# Patient Record
Sex: Female | Born: 1940 | Race: White | Hispanic: No | Smoking: Former smoker
Health system: Southern US, Community
[De-identification: ages and names within clinical notes are randomized; demographics above are authoritative.]

## PROBLEM LIST (undated history)

## (undated) DIAGNOSIS — I10 Essential (primary) hypertension: Secondary | ICD-10-CM

## (undated) DIAGNOSIS — I509 Heart failure, unspecified: Secondary | ICD-10-CM

## (undated) DIAGNOSIS — I4891 Unspecified atrial fibrillation: Secondary | ICD-10-CM

## (undated) DIAGNOSIS — N183 Chronic kidney disease, stage 3 unspecified: Secondary | ICD-10-CM

## (undated) DIAGNOSIS — E785 Hyperlipidemia, unspecified: Secondary | ICD-10-CM

## (undated) DIAGNOSIS — E669 Obesity, unspecified: Secondary | ICD-10-CM

## (undated) DIAGNOSIS — E079 Disorder of thyroid, unspecified: Secondary | ICD-10-CM

## (undated) HISTORY — DX: Hyperlipidemia, unspecified: E78.5

## (undated) HISTORY — DX: Disorder of thyroid, unspecified: E07.9

## (undated) HISTORY — DX: Essential (primary) hypertension: I10

## (undated) HISTORY — DX: Obesity, unspecified: E66.9

---
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
## 1975-07-06 HISTORY — PX: PARTIAL HYSTERECTOMY: SHX80

## 1998-07-05 HISTORY — PX: CHOLECYSTECTOMY: SHX55

## 2007-12-31 ENCOUNTER — Ambulatory Visit: Payer: Self-pay | Admitting: Cardiology

## 2007-12-31 ENCOUNTER — Inpatient Hospital Stay (HOSPITAL_COMMUNITY): Admission: EM | Admit: 2007-12-31 | Discharge: 2008-01-03 | Payer: Self-pay | Admitting: Cardiology

## 2009-12-30 ENCOUNTER — Ambulatory Visit: Payer: Self-pay | Admitting: Cardiovascular Disease

## 2010-11-17 NOTE — Discharge Summary (Signed)
Connie Cooley, Connie Cooley               ACCOUNT NO.:  [id number]   MEDICAL RECORD NO.:  [id number]          PATIENT TYPE:  INP   LOCATION:  4729                         FACILITY:  [facility]   PHYSICIAN:  Juanda Bond. Burt Knack, MD  DATE OF BIRTH:  1941-02-10   DATE OF ADMISSION:  12/31/2007  DATE OF DISCHARGE:  01/03/2008                               DISCHARGE SUMMARY   PRIMARY CARDIOLOGIST:  Juanda Bond. Burt Knack, MD   PRIMARY CARE PHYSICIAN:  Leavy Cella, MD, [place]   PROCEDURES PERFORMED DURING HOSPITALIZATION:  1. Cardiac catheterization completed by Dr. Sherren Mocha on January 02, 2008.  A:  Mild diffuse luminal irregularities in the right coronary artery and  proximal LAD without significant stenosis, hyperdynamic LV systolic  function with LVEF of 70%-75%, widely patent renal arteries.  Recommend  medical therapy and titration of anti-hypertensive medications   FINAL DISCHARGE DIAGNOSES:  1. Chest pain.  A:  Status post cardiac catheterization revealing minimal nonobstructive  coronary artery disease.  B:  Suspect secondary to hypertensive urgency.  1. Malignant hypertension.  2. Mild thrombocytopenia, negative heparin-induced thrombocytopenia      profile.  3. Hypokalemia.  4. Diabetes.  5. Hyperlipidemia.  6. Hypothyroidism.   HOSPITAL COURSE:  This is a 70 year old Caucasian female with history of  diabetes, hypertension, hyperlipidemia who was seen at [facility] secondary to chest discomfort which awakened her from  sleep.  The patient also had associated palpitations and mild nausea.  The patient was found to be hypertensive with a blood pressure of  222/120 on EMS arrival.  The patient was given nitroglycerin sublingual  in the emergency department and the pain diminished.  The patient states  that she has had increasing frequency over the last several days of  hypertension but prior to this no other chest discomfort associated.  The patient was  evaluated with a nuclear stress test approximately 5  years ago.   The patient was seen and examined by Dr. Karma Lew, cardiology fellow  for Dr. Sherren Mocha.  The patient was admitted to rule out myocardial  infarction and to have that her blood pressure control.  She had mildly  elevated biomarkers but it was felt that it was related to her  hypertensive etiology.  The patient was started on Lovenox and kept  n.p.o. for evaluation with followup cardiac catheterization if  necessary.   The patient was seen and examined over the weekend by Dr. Terald Sleeper and  Rosanne Sack, nurse practitioner.  The patient was found to have some  positive hematuria.  It was also evaluated because of thrombocytopenia  with platelets of 91.  A HIT profile was completed and initially was  negative.  Repeat further labs scanning revealed negative.  The  patient's hemoglobin A1c was found to be mildly elevated at 8.6.  The  patient did not have any recurrence of chest pain or palpitations once  her blood pressure was well controlled.  The patient remained stable and  had subsequent plans for cardiac catheterization which were completed on  January 02, 2008.  Please see Dr. Legrand Como Cooper's thorough cardiac  catheterization note for more details.  The patient had no episodes of  chest pain during the catheterization.  There were no evidence of  bleeding, hematoma, or signs of infection at infection at the cath  sites.  On January 03, 2008, the patient was seen and examined by Dr. Burt Knack  and found to be stable.  She was mildly hypokalemic with potassium of  3.2 which was repleted.  Her platelets had increased to 116.  The  patient had medications adjusted with new medications added to include  Norvasc 10 mg daily, clonidine 0.1 mg b.i.d., and potassium 20 mEq  daily.  She was advised to stop taking diltiazem and continue all other  medications prior to admission.   DISCHARGE LABORATORY DATA:  Sodium 141,  potassium 3.2, chloride 104, CO2  26, BUN 11, creatinine 0.53, glucose 200 (potassium was repleted).  Hemoglobin 13.3, hematocrit 38.6, white blood cells 6.3, platelets 116.  The patient's blood pressure was mildly elevated at 183/82, heart rate  59, respirations 18 with a temperature of 97.4.  Troponins 0.09.  EKG  revealing sinus bradycardia, ventricular rate of 50 beats per minute  with nonspecific T-wave abnormality in leads V5 and V6 dated January 01, 2008.   DISCHARGE MEDICATIONS:  1. Diovan 320 mg daily.  2. Tekturna 150 mg daily.  3. Norvasc 10 mg daily (new prescription provided).  4. Hydrochlorothiazide 12.5 mg daily.  5. Clonidine 0.1 mg twice a day (new prescription provided).  6. K-Dur 20 mEq daily (new prescription provided).  7. Synthroid 125 mcg daily.  8. Detrol LA 4 mg daily.  9. Metformin 1000 mg daily (the patient is to start taking on January 04, 2008).  10.Sertraline 100 mg daily.  11.Insulin 100 units daily in a.m.  12.Aspirin 81 mg daily.   FOLLOWUP APPOINTMENTS:  1. The patient is to follow up with Dr. Leavy Cella in 2 weeks for      post hospitalization appointment.  2. During that appointment, the patient will need to have a BMET drawn      for evaluation of kidney function post catheterization in the      setting of hypertension and diabetes.  3. The patient has been advised on post cardiac catheterization      instructions with particular emphasis on the right groin site for      evidence of bleeding, hematoma, or signs of infection.   Time spent with the patient to include physician time is 45 minutes.     Phill Myron. Purcell Nails, NP      Juanda Bond. Burt Knack, MD  Electronically Signed   KML/MEDQ  D:  01/03/2008  T:  01/04/2008  Job:  [id number]   cc:   Leavy Cella, M.D.

## 2010-11-17 NOTE — Assessment & Plan Note (Signed)
[facility] CARDIOLOGY OFFICE NOTE   [name], [name]            MRN:          [id number]  DATE:12/30/2009                            DOB:          19-Dec-1940    CHIEF COMPLAINT:  Hypertension.   HISTORY OF PRESENT ILLNESS:  [name] is 70 year old female past  medical history significant for malignant hypertension, obesity,  hyperlipidemia, diabetes who is presenting for management of her blood  pressure.  The patient states that over the past year, she has had 8  separate presentations to the emergency room with hypertension.  Usually, she is checking her blood pressure with her cuff at home and  realized that is severely elevated and she reports.  One time in the  past several months, she woke up in the middle of the night with  nosebleeds and was found to have systolic blood pressure over 200s.  She  has had multiple changes to her medications and most recently, she has  had the clonidine patch 0.2 mg added to a regimen which already include  clonidine 0.2 mg t.i.d.  Today, she comes in complaining of dizziness.  In general, the patient denies any chest discomfort, shortness of  breath, lower extremity edema, headaches, or syncopal episodes  associated with high blood pressure or otherwise.  She states that she  is dizzy for the first time this morning.  She states that she is very  compliant with her medical regimen and very rarely if ever misses any  doses.  She does use anti-inflammatory medications on a daily basis and  she does endorse issues with snoring and bad sleep habits.   PAST MEDICAL HISTORY:  As above in HPI.   SOCIAL HISTORY:  No current tobacco or alcohol use.  She lives by  herself.   FAMILY HISTORY:  Negative for premature coronary artery disease.   ALLERGIES:  No known drug allergies.   CURRENT MEDICATIONS:  Clonidine patch 0.2 mg to be changed weekly,  clonidine 0.2 mg  p.o. t.i.d., amlodipine 10 mg daily, Diovan 320 mg  daily, lisinopril 40 mg daily, hydralazine 50 mg every 6 hours,  sertraline, metformin, Lasix 40 mg daily, aspirin 81 mg daily,  oxybutynin, simvastatin 40 mg every evening, Humulin 70/30 insulin.   REVIEW OF SYSTEMS:  As in HPI.  In addition, the patient endorses  anxiety and depression.  Other systems reviewed and are negative.   PHYSICAL EXAMINATION:  VITAL SIGNS:  Today, her blood pressure is 116/56  with a heart rate of 43, this is a large cuff.  Retaken in the right  arm, blood pressure 88/52.  She is satting 98% on room air.  She weighs  203 pounds.  GENERAL:  No acute distress.  HEENT:  Normocephalic, atraumatic.  NECK:  Supple.  No JVD.  No carotid bruits.  HEART:  Regular rate and rhythm with a 2/6 systolic murmur along the  left sternal border.  LUNGS:  Clear bilaterally.  ABDOMEN:  Soft, nontender.  EXTREMITIES:  Without edema.  SKIN:  Warm and dry.  MUSCULOSKELETAL:  Bilateral upper and lower extremity strength 5/5.  NEUROLOGIC:  Generally nonfocal.  EKG taken in today in the office demonstrates sinus bradycardia with a  rate of 44 beats per minute.  There is a mildly delayed R-wave  progression.  No significant ST or T-wave abnormalities.  Review of  echocardiogram report dated February 2011, her ejection fraction was  within normal limits.  There was a relaxation abnormality and there was  mild mitral regurgitation and a sclerotic aortic valve.  Review of the  patient's lab dated September 16, 2009, her aldosterone was 1.2, her renin  activity was less than 0.15.  Cortisol was 11.2.  Review of additional  blood work from March demonstrates sodium of 132, potassium 4.6,  chloride 94, CO2 30, BUN 37, creatinine 0.9, glucose is 105.  Repeat in  June 2011, sodium 141, potassium 3.7, chloride 104, CO2 24, BUN 14,  creatinine 0.6, glucose 183.  LFTs were within normal limits.  Review of  other records indicate that she had  an MRI of the abdomen which have  ruled out renal artery stenosis.   ASSESSMENT AND PLAN:  A 71 year old female with malignant hypertension  on multiple antihypertensives.  It appears the patient is on p.o.  clonidine in addition to the clonidine patch, which is likely the cause  of her hypotension and bradycardia today.  Because she is currently  symptomatic from the hypotension and bradycardia and she lives alone, I  have arranged admission to the [facility] for  observation and medical management of her hypertension.  I have  discontinued the clonidine patch today here in the office which may be  the only current intervention that she needs.  I have advised the  patient to stop all her anti-inflammatory medications as she states she  currently takes Advil on a daily basis.  She will also need to have a  sleep study arranged in the future.  She likely has obstructive sleep  apnea and would benefit from CPAP.  We will arrange to see her after  hospital discharge for further medication management.  At that time, a  repeat renin and aldosterone level may be helpful in guiding further  medical changes.     Arlee Muslim, MD  Electronically Signed    SGA/MedQ  DD: 12/30/2009  DT: 12/31/2009  Job #: [id number]

## 2010-11-17 NOTE — H&P (Signed)
NAMELARELLE, ARBON               ACCOUNT NO.:  [id number]   MEDICAL RECORD NO.:  [id number]          PATIENT TYPE:  INP   LOCATION:  4729                         FACILITY:  [facility]   PHYSICIAN:  Karma Lew, MD          DATE OF BIRTH:  01-09-1941   DATE OF ADMISSION:  12/31/2007  DATE OF DISCHARGE:                              HISTORY & PHYSICAL   CHIEF COMPLAINT:  Chest pain x5 hours.   HISTORY OF PRESENTING IDLENESS:  The patient is a 70 year old white  female with history of diabetes, hypertension, hyperlipidemia, and  complaints of chest discomfort that woke her up from sleep.  At  approximately 5 p.m. today, it was associated with palpitations, some  mild nausea, and she called EMS.  She was found to have blood pressure  of 222/120 on EMS' arrival, and her chest pain resolved after receiving  nitroglycerin in the emergency department.  She reports that she has had  a couple of these episodes that have been increasing in frequency over  the past several days but prior to this has not had any type of chest  discomfort.  She had a negative nuclear stress test 5 years ago and no  prior significant cardiac history.   PAST MEDICAL HISTORY:  1. Diabetes, on insulin.  2. Hypertension.  3. Hyperlipidemia.  4. Hypothyroidism.   MEDICATIONS:  1. Lantus 100 units a day.  2. Synthroid 125 mcg a day.  3. Metformin 1 g b.i.d.  4. Aspirin 81 mg a day.  5. Diovan, she does not recall the dose, and she does not recall any      other medication that she is on.   SOCIAL HISTORY:  She lives in [place].  Former smoker, quit 30 years  ago.  No alcohol, no drug use.   FAMILY HISTORY:  Noncontributory to the patient's current medical  condition.   ALLERGIES:  No known drug allergies.   REVIEW OF SYSTEMS:  Negative 11-point review of systems except for those  as dictated in the above HPI.   PHYSICAL EXAMINATION:  VITAL SIGNS:  Blood pressure is 174/90; heart  rate of 78; and T-max, she is  afebrile.  GENERAL:  Well-developed, well-nourished white female in no acute  distress.  HEENT:  Moist mucous membranes.  Sclerae anicteric.  No conjunctival  pallor.  NECK:  Supple.  Full range of motion.  No jugular venous distention.  CARDIOVASCULAR:  Regular rate and rhythm.  No rubs, murmurs, or gallops.  CHEST:  Clear to auscultation bilaterally.  No wheezes, rales, or  rhonchi.  ABDOMEN:  Soft, nontender, and nondistended.  Normoactive bowel sounds.  EXTREMITIES:  No peripheral edema, pulses 2+ bilaterally.  NEUROLOGIC:  Alert and oriented x3.  Cranial nerves II through XII  grossly intact and nonfocal exam.   Chest x-ray demonstrates no acute infiltrative process.  EKG normal  sinus rhythm, nonspecific ST and T-wave abnormality.   LABORATORY DATA:  Significant for sodium 138, potassium 3.9, BUN and  creatinine 19 and 0.57, and glucose 165.  Her myoglobin is within normal  limits.  Her troponin high as mildly elevated at 0.59.  Her liver  function tests are within normal limits.  White count of 11.2,  hemoglobin is 16.2.   IMPRESSION:  1. Acute coronary syndrome, unstable angina versus non-ST segment      elevation myocardial infarction.  2. Diabetes.  3. Hypothyroidism.  4. Uncontrolled hypertension.  5. Possible hypertensive urgency.   PLAN:  I think that the patient's mildly elevated biomarkers may be  related to her hypertensive etiology.  We will have to follow serial  biomarkers to see if she truly has any acute coronary syndrome.  At  least for now, medically manage this with heparin protocol and restart  it in the morning as she received Lovenox at 8 o'clock tonight.  We will  also start her on statin, beta-blocker, ACE inhibitor and aspirin.  Diabetes, controlled with sliding-scale insulin, her home dose of Lantus  will be cut in half due to her n.p.o. status.  Hypothyroidism, we will  check TSH, otherwise continue her home dose.      Karma Lew, MD   Electronically Signed     JT/MEDQ  D:  12/31/2007  T:  12/31/2007  Job:  [id number]

## 2010-11-17 NOTE — Letter (Signed)
December 30, 2009    Dr. Reinaldo Meeker  [address], [place]  [address]   RE:  Connie Cooley, Connie Cooley  MRN:  [id number]  /  DOB:  [birth year]   Dear Dr. Henrene Pastor:   I had the pleasure of seeing [name] in clinic today.  As you know  she is a 70 year old female with malignant hypertension.  Today in  clinic she presented with some dizziness, and was found to have a blood  pressure of 88/52, and her EKG revealed sinus bradycardia with a rate of  44 beats per minute.  Apparently the patient is on a clonidine patch 0.2  mg as well as taking clonidine 0.2 mg p.o. t.i.d.  This is likely the  cause of the bradycardia and hypotension.  I have [place] the clonidine  patch today, and have arranged admission to the [facility] for observation.  I have also asked the patient to  stop the anti-inflammatory medications which she takes on a daily basis.  She will also need a sleep study in the future as she likely has  obstructive sleep apnea, and would add benefit from CPAP.   Thank you for the referral of this patient, and please contact the  office with any questions or concerns.    Sincerely,      Arlee Muslim, MD  Electronically Signed    SGA/MedQ  DD: 12/30/2009  DT: 12/30/2009  Job #: [id number]

## 2011-03-25 ENCOUNTER — Encounter: Payer: Self-pay | Admitting: Cardiovascular Disease

## 2011-04-01 LAB — BASIC METABOLIC PANEL
BUN: 14
CO2: 24
CO2: 25
CO2: 26
Calcium: 9.2
Calcium: 9.3
Chloride: 104
Creatinine, Ser: 0.5
Creatinine, Ser: 0.53
GFR calc Af Amer: >60
GFR calc Af Amer: >60
GFR calc non Af Amer: >60
GFR calc non Af Amer: >60
Glucose, Bld: 132 — ABNORMAL HIGH
Glucose, Bld: 172 — ABNORMAL HIGH
Potassium: 3.7
Sodium: 136
Sodium: 136
Sodium: 141

## 2011-04-01 LAB — CBC
HCT: 39.9
Hemoglobin: 13.6
Hemoglobin: 14.1
Hemoglobin: 14.3
Hemoglobin: 14.5
Hemoglobin: 13.3
MCHC: 34.4
MCHC: 34.5
MCHC: 35
MCHC: 35.2
MCV: 83.9
MCV: 85.3
MCV: 85.3
Platelets: 127 — ABNORMAL LOW
Platelets: 131 — ABNORMAL LOW
Platelets: 134 — ABNORMAL LOW
Platelets: 164
RBC: 4.66
RBC: 4.68
RBC: 4.85
RBC: 4.86
RBC: 4.52
RDW: 13.1
RDW: 13.2
RDW: 13.3
RDW: 13.6
WBC: 10.4
WBC: 7.8
WBC: 6.3

## 2011-04-01 LAB — DIFFERENTIAL
Basophils Relative: 0
Basophils Relative: 0
Eosinophils Absolute: 0.1
Eosinophils Relative: 1
Lymphs Abs: 1.6
Monocytes Absolute: 0.6
Monocytes Relative: 6
Monocytes Relative: 7
Neutro Abs: 8 — ABNORMAL HIGH
Neutrophils Relative %: 77

## 2011-04-01 LAB — HEPARIN INDUCED THROMBOCYTOPENIA PNL: Patient O.D.: 0.227

## 2011-04-01 LAB — HEPARIN ANTIBODY SCREEN
Heparin Antibody Screen: NEGATIVE
Heparin Antibody Screen: POSITIVE

## 2011-04-01 LAB — CARDIAC PANEL(CRET KIN+CKTOT+MB+TROPI)
CK, MB: 3.5
Relative Index: INVALID
Relative Index: INVALID
Total CK: 73
Total CK: 77
Troponin I: 0.06
Troponin I: 0.09 — ABNORMAL HIGH

## 2011-04-01 LAB — LIPID PANEL
Cholesterol: 201 — ABNORMAL HIGH
HDL: 29 — ABNORMAL LOW

## 2011-04-01 LAB — FIBRINOGEN: Fibrinogen: 236

## 2011-04-01 LAB — PROTIME-INR
INR: 1
Prothrombin Time: 13.8

## 2011-04-01 LAB — APTT: aPTT: 30

## 2011-04-01 LAB — D-DIMER, QUANTITATIVE: D-Dimer, Quant: 0.22

## 2014-10-24 ENCOUNTER — Other Ambulatory Visit: Payer: Self-pay

## 2014-10-24 NOTE — Patient Outreach (Signed)
[facility]) Care Management  10/24/2014  Connie Cooley [birth year] [id number]  Telephone call to patient regarding primary MD referral.  Unable to reach patient. HIPAA compliant voice message left with call back phone number. Called patients primary MD office and confirmed patients contact phone number with Amy.  Number confirmed [phone]  PLAN:  RNCM will attempt 2nd telephone call to patient within 1 week.   Quinn Plowman RN,BSN,CCM [place] Coordinator [phone]

## 2014-10-28 ENCOUNTER — Ambulatory Visit: Payer: Self-pay

## 2014-10-28 ENCOUNTER — Other Ambulatory Visit: Payer: Self-pay

## 2014-10-28 NOTE — Patient Outreach (Signed)
[place]) Care Management  10/28/2014  SRITHA SNYDER [birth year] [id number]   Second telephone call to patient regarding primary MD  Referral.  Unable to reach patient or leave voice message.  Phone only rang.  PLAN:   RNCM will attempt 3rd telephone outreach to patient within 1 week.  Quinn Plowman RN,BSN,CCM [place] Coordinator [phone]

## 2014-10-30 ENCOUNTER — Ambulatory Visit: Payer: Self-pay

## 2014-11-01 ENCOUNTER — Other Ambulatory Visit: Payer: Self-pay

## 2014-11-01 NOTE — Patient Outreach (Signed)
[facility]) Care Management  11/01/2014  NAMRATA SEANEZ 25-May-1941 [id number]  Third telephone call to patient regarding doctor referral.  No answer.     Plan: RNCM will send outreach letter to attempt contact.    Jone Baseman, RN, MSN [place] Telephonic [facility] Care Management [phone]

## 2014-11-18 NOTE — Patient Outreach (Signed)
[facility] [facility]) Care Management  11/18/2014  CYNDEL URNESS 1941-05-17 [id number]    No response from patient after 3 outreach calls and letter.  Plan: RNCM will forward patient information to Lurline Del for case closure.   RNCM will send letter to primary care physician notifying of case closure.   Jone Baseman, RN, MSN [place] [phone]

## 2014-11-25 NOTE — Patient Outreach (Signed)
[facility]) Care Management  11/25/2014  IKHLAS VAGNONI Jun 28, 1941 [id number]   Received notification from Jon Billings, RN to close case due to unable to contact.  Ronnell Freshwater. [place], [place] Management [place] Assistant Phone: [phone] Fax: [phone]

## 2015-07-27 DIAGNOSIS — R112 Nausea with vomiting, unspecified: Secondary | ICD-10-CM | POA: Diagnosis not present

## 2015-07-27 DIAGNOSIS — N3001 Acute cystitis with hematuria: Secondary | ICD-10-CM | POA: Diagnosis not present

## 2015-08-05 DIAGNOSIS — Z7901 Long term (current) use of anticoagulants: Secondary | ICD-10-CM | POA: Diagnosis not present

## 2015-08-05 DIAGNOSIS — E1142 Type 2 diabetes mellitus with diabetic polyneuropathy: Secondary | ICD-10-CM | POA: Diagnosis not present

## 2015-08-05 DIAGNOSIS — Z6838 Body mass index (BMI) 38.0-38.9, adult: Secondary | ICD-10-CM | POA: Diagnosis not present

## 2015-08-05 DIAGNOSIS — I4891 Unspecified atrial fibrillation: Secondary | ICD-10-CM | POA: Diagnosis not present

## 2015-08-05 DIAGNOSIS — I10 Essential (primary) hypertension: Secondary | ICD-10-CM | POA: Diagnosis not present

## 2015-08-05 DIAGNOSIS — E785 Hyperlipidemia, unspecified: Secondary | ICD-10-CM | POA: Diagnosis not present

## 2015-08-05 DIAGNOSIS — N183 Chronic kidney disease, stage 3 (moderate): Secondary | ICD-10-CM | POA: Diagnosis not present

## 2015-08-05 DIAGNOSIS — E1140 Type 2 diabetes mellitus with diabetic neuropathy, unspecified: Secondary | ICD-10-CM | POA: Diagnosis not present

## 2015-08-05 DIAGNOSIS — E1129 Type 2 diabetes mellitus with other diabetic kidney complication: Secondary | ICD-10-CM | POA: Diagnosis not present

## 2015-08-13 DIAGNOSIS — E11319 Type 2 diabetes mellitus with unspecified diabetic retinopathy without macular edema: Secondary | ICD-10-CM | POA: Diagnosis not present

## 2015-08-13 DIAGNOSIS — H2513 Age-related nuclear cataract, bilateral: Secondary | ICD-10-CM | POA: Diagnosis not present

## 2015-08-19 DIAGNOSIS — I10 Essential (primary) hypertension: Secondary | ICD-10-CM | POA: Diagnosis not present

## 2015-08-19 DIAGNOSIS — N390 Urinary tract infection, site not specified: Secondary | ICD-10-CM | POA: Diagnosis not present

## 2015-08-19 DIAGNOSIS — N183 Chronic kidney disease, stage 3 (moderate): Secondary | ICD-10-CM | POA: Diagnosis not present

## 2015-08-19 DIAGNOSIS — E1129 Type 2 diabetes mellitus with other diabetic kidney complication: Secondary | ICD-10-CM | POA: Diagnosis not present

## 2015-08-19 DIAGNOSIS — I4891 Unspecified atrial fibrillation: Secondary | ICD-10-CM | POA: Diagnosis not present

## 2015-08-19 DIAGNOSIS — Z6838 Body mass index (BMI) 38.0-38.9, adult: Secondary | ICD-10-CM | POA: Diagnosis not present

## 2015-08-19 DIAGNOSIS — E785 Hyperlipidemia, unspecified: Secondary | ICD-10-CM | POA: Diagnosis not present

## 2015-08-19 DIAGNOSIS — Z7901 Long term (current) use of anticoagulants: Secondary | ICD-10-CM | POA: Diagnosis not present

## 2015-08-26 DIAGNOSIS — N390 Urinary tract infection, site not specified: Secondary | ICD-10-CM | POA: Diagnosis not present

## 2015-09-09 DIAGNOSIS — E113293 Type 2 diabetes mellitus with mild nonproliferative diabetic retinopathy without macular edema, bilateral: Secondary | ICD-10-CM | POA: Diagnosis not present

## 2015-09-09 DIAGNOSIS — H2513 Age-related nuclear cataract, bilateral: Secondary | ICD-10-CM | POA: Diagnosis not present

## 2015-09-11 DIAGNOSIS — E875 Hyperkalemia: Secondary | ICD-10-CM | POA: Diagnosis not present

## 2015-09-11 DIAGNOSIS — Z7901 Long term (current) use of anticoagulants: Secondary | ICD-10-CM | POA: Diagnosis not present

## 2015-09-18 DIAGNOSIS — E113312 Type 2 diabetes mellitus with moderate nonproliferative diabetic retinopathy with macular edema, left eye: Secondary | ICD-10-CM | POA: Diagnosis not present

## 2015-10-02 DIAGNOSIS — Z7901 Long term (current) use of anticoagulants: Secondary | ICD-10-CM | POA: Diagnosis not present

## 2015-10-02 DIAGNOSIS — E113312 Type 2 diabetes mellitus with moderate nonproliferative diabetic retinopathy with macular edema, left eye: Secondary | ICD-10-CM | POA: Diagnosis not present

## 2015-11-13 DIAGNOSIS — E113312 Type 2 diabetes mellitus with moderate nonproliferative diabetic retinopathy with macular edema, left eye: Secondary | ICD-10-CM | POA: Diagnosis not present

## 2015-12-05 DIAGNOSIS — E1140 Type 2 diabetes mellitus with diabetic neuropathy, unspecified: Secondary | ICD-10-CM | POA: Diagnosis not present

## 2015-12-05 DIAGNOSIS — E785 Hyperlipidemia, unspecified: Secondary | ICD-10-CM | POA: Diagnosis not present

## 2015-12-05 DIAGNOSIS — E1129 Type 2 diabetes mellitus with other diabetic kidney complication: Secondary | ICD-10-CM | POA: Diagnosis not present

## 2015-12-05 DIAGNOSIS — N183 Chronic kidney disease, stage 3 (moderate): Secondary | ICD-10-CM | POA: Diagnosis not present

## 2015-12-05 DIAGNOSIS — N390 Urinary tract infection, site not specified: Secondary | ICD-10-CM | POA: Diagnosis not present

## 2015-12-05 DIAGNOSIS — I10 Essential (primary) hypertension: Secondary | ICD-10-CM | POA: Diagnosis not present

## 2015-12-05 DIAGNOSIS — Z6838 Body mass index (BMI) 38.0-38.9, adult: Secondary | ICD-10-CM | POA: Diagnosis not present

## 2015-12-05 DIAGNOSIS — I4891 Unspecified atrial fibrillation: Secondary | ICD-10-CM | POA: Diagnosis not present

## 2015-12-05 DIAGNOSIS — E1142 Type 2 diabetes mellitus with diabetic polyneuropathy: Secondary | ICD-10-CM | POA: Diagnosis not present

## 2016-01-01 DIAGNOSIS — E113312 Type 2 diabetes mellitus with moderate nonproliferative diabetic retinopathy with macular edema, left eye: Secondary | ICD-10-CM | POA: Diagnosis not present

## 2016-02-05 DIAGNOSIS — E113312 Type 2 diabetes mellitus with moderate nonproliferative diabetic retinopathy with macular edema, left eye: Secondary | ICD-10-CM | POA: Diagnosis not present

## 2016-02-09 DIAGNOSIS — N390 Urinary tract infection, site not specified: Secondary | ICD-10-CM | POA: Diagnosis not present

## 2016-02-09 DIAGNOSIS — Z6838 Body mass index (BMI) 38.0-38.9, adult: Secondary | ICD-10-CM | POA: Diagnosis not present

## 2016-02-09 DIAGNOSIS — Z7901 Long term (current) use of anticoagulants: Secondary | ICD-10-CM | POA: Diagnosis not present

## 2016-02-16 DIAGNOSIS — K5901 Slow transit constipation: Secondary | ICD-10-CM | POA: Diagnosis not present

## 2016-02-16 DIAGNOSIS — R109 Unspecified abdominal pain: Secondary | ICD-10-CM | POA: Diagnosis not present

## 2016-02-16 DIAGNOSIS — N3000 Acute cystitis without hematuria: Secondary | ICD-10-CM | POA: Diagnosis not present

## 2016-02-16 DIAGNOSIS — R1032 Left lower quadrant pain: Secondary | ICD-10-CM | POA: Diagnosis not present

## 2016-03-18 DIAGNOSIS — E113312 Type 2 diabetes mellitus with moderate nonproliferative diabetic retinopathy with macular edema, left eye: Secondary | ICD-10-CM | POA: Diagnosis not present

## 2016-03-19 DIAGNOSIS — E785 Hyperlipidemia, unspecified: Secondary | ICD-10-CM | POA: Diagnosis not present

## 2016-03-19 DIAGNOSIS — I10 Essential (primary) hypertension: Secondary | ICD-10-CM | POA: Diagnosis not present

## 2016-03-19 DIAGNOSIS — E119 Type 2 diabetes mellitus without complications: Secondary | ICD-10-CM | POA: Diagnosis not present

## 2016-03-22 DIAGNOSIS — Z23 Encounter for immunization: Secondary | ICD-10-CM | POA: Diagnosis not present

## 2016-03-22 DIAGNOSIS — E1140 Type 2 diabetes mellitus with diabetic neuropathy, unspecified: Secondary | ICD-10-CM | POA: Diagnosis not present

## 2016-03-22 DIAGNOSIS — I10 Essential (primary) hypertension: Secondary | ICD-10-CM | POA: Diagnosis not present

## 2016-03-22 DIAGNOSIS — Z7901 Long term (current) use of anticoagulants: Secondary | ICD-10-CM | POA: Diagnosis not present

## 2016-03-22 DIAGNOSIS — E785 Hyperlipidemia, unspecified: Secondary | ICD-10-CM | POA: Diagnosis not present

## 2016-04-20 DIAGNOSIS — N390 Urinary tract infection, site not specified: Secondary | ICD-10-CM | POA: Diagnosis not present

## 2016-04-22 DIAGNOSIS — Z7901 Long term (current) use of anticoagulants: Secondary | ICD-10-CM | POA: Diagnosis not present

## 2016-04-29 DIAGNOSIS — E113312 Type 2 diabetes mellitus with moderate nonproliferative diabetic retinopathy with macular edema, left eye: Secondary | ICD-10-CM | POA: Diagnosis not present

## 2016-05-20 DIAGNOSIS — Z7901 Long term (current) use of anticoagulants: Secondary | ICD-10-CM | POA: Diagnosis not present

## 2016-06-11 DIAGNOSIS — E1149 Type 2 diabetes mellitus with other diabetic neurological complication: Secondary | ICD-10-CM | POA: Diagnosis not present

## 2016-06-11 DIAGNOSIS — E1142 Type 2 diabetes mellitus with diabetic polyneuropathy: Secondary | ICD-10-CM | POA: Diagnosis not present

## 2016-06-11 DIAGNOSIS — E782 Mixed hyperlipidemia: Secondary | ICD-10-CM | POA: Diagnosis not present

## 2016-06-11 DIAGNOSIS — I10 Essential (primary) hypertension: Secondary | ICD-10-CM | POA: Diagnosis not present

## 2016-06-11 DIAGNOSIS — N183 Chronic kidney disease, stage 3 (moderate): Secondary | ICD-10-CM | POA: Diagnosis not present

## 2016-06-11 DIAGNOSIS — E1129 Type 2 diabetes mellitus with other diabetic kidney complication: Secondary | ICD-10-CM | POA: Diagnosis not present

## 2016-06-11 DIAGNOSIS — I6300 Cerebral infarction due to thrombosis of unspecified precerebral artery: Secondary | ICD-10-CM | POA: Diagnosis not present

## 2016-06-11 DIAGNOSIS — I481 Persistent atrial fibrillation: Secondary | ICD-10-CM | POA: Diagnosis not present

## 2016-06-11 DIAGNOSIS — G4733 Obstructive sleep apnea (adult) (pediatric): Secondary | ICD-10-CM | POA: Diagnosis not present

## 2016-06-11 DIAGNOSIS — G608 Other hereditary and idiopathic neuropathies: Secondary | ICD-10-CM | POA: Diagnosis not present

## 2016-07-26 DIAGNOSIS — E1149 Type 2 diabetes mellitus with other diabetic neurological complication: Secondary | ICD-10-CM | POA: Diagnosis not present

## 2016-07-26 DIAGNOSIS — I10 Essential (primary) hypertension: Secondary | ICD-10-CM | POA: Diagnosis not present

## 2016-07-26 DIAGNOSIS — I6350 Cerebral infarction due to unspecified occlusion or stenosis of unspecified cerebral artery: Secondary | ICD-10-CM | POA: Diagnosis not present

## 2016-07-26 DIAGNOSIS — R413 Other amnesia: Secondary | ICD-10-CM | POA: Diagnosis not present

## 2016-07-26 DIAGNOSIS — N183 Chronic kidney disease, stage 3 (moderate): Secondary | ICD-10-CM | POA: Diagnosis not present

## 2016-07-26 DIAGNOSIS — E782 Mixed hyperlipidemia: Secondary | ICD-10-CM | POA: Diagnosis not present

## 2016-07-26 DIAGNOSIS — G4733 Obstructive sleep apnea (adult) (pediatric): Secondary | ICD-10-CM | POA: Diagnosis not present

## 2016-07-26 DIAGNOSIS — I481 Persistent atrial fibrillation: Secondary | ICD-10-CM | POA: Diagnosis not present

## 2016-07-26 DIAGNOSIS — E1142 Type 2 diabetes mellitus with diabetic polyneuropathy: Secondary | ICD-10-CM | POA: Diagnosis not present

## 2016-07-26 DIAGNOSIS — R412 Retrograde amnesia: Secondary | ICD-10-CM | POA: Diagnosis not present

## 2016-07-26 DIAGNOSIS — E1129 Type 2 diabetes mellitus with other diabetic kidney complication: Secondary | ICD-10-CM | POA: Diagnosis not present

## 2016-07-26 DIAGNOSIS — I4892 Unspecified atrial flutter: Secondary | ICD-10-CM | POA: Diagnosis not present

## 2016-09-12 DIAGNOSIS — N3000 Acute cystitis without hematuria: Secondary | ICD-10-CM | POA: Diagnosis not present

## 2016-09-12 DIAGNOSIS — N3090 Cystitis, unspecified without hematuria: Secondary | ICD-10-CM | POA: Diagnosis not present

## 2016-09-16 DIAGNOSIS — E113312 Type 2 diabetes mellitus with moderate nonproliferative diabetic retinopathy with macular edema, left eye: Secondary | ICD-10-CM | POA: Diagnosis not present

## 2016-09-24 DIAGNOSIS — N760 Acute vaginitis: Secondary | ICD-10-CM | POA: Diagnosis not present

## 2016-09-24 DIAGNOSIS — N3001 Acute cystitis with hematuria: Secondary | ICD-10-CM | POA: Diagnosis not present

## 2016-09-28 DIAGNOSIS — K635 Polyp of colon: Secondary | ICD-10-CM | POA: Diagnosis not present

## 2016-09-28 DIAGNOSIS — J9601 Acute respiratory failure with hypoxia: Secondary | ICD-10-CM | POA: Diagnosis not present

## 2016-09-28 DIAGNOSIS — E1142 Type 2 diabetes mellitus with diabetic polyneuropathy: Secondary | ICD-10-CM | POA: Diagnosis not present

## 2016-09-28 DIAGNOSIS — N183 Chronic kidney disease, stage 3 (moderate): Secondary | ICD-10-CM | POA: Diagnosis not present

## 2016-09-28 DIAGNOSIS — R918 Other nonspecific abnormal finding of lung field: Secondary | ICD-10-CM | POA: Diagnosis not present

## 2016-09-28 DIAGNOSIS — K921 Melena: Secondary | ICD-10-CM | POA: Diagnosis not present

## 2016-09-28 DIAGNOSIS — F0390 Unspecified dementia without behavioral disturbance: Secondary | ICD-10-CM | POA: Diagnosis not present

## 2016-09-28 DIAGNOSIS — J159 Unspecified bacterial pneumonia: Secondary | ICD-10-CM | POA: Diagnosis not present

## 2016-09-28 DIAGNOSIS — R52 Pain, unspecified: Secondary | ICD-10-CM | POA: Diagnosis not present

## 2016-09-28 DIAGNOSIS — K922 Gastrointestinal hemorrhage, unspecified: Secondary | ICD-10-CM | POA: Diagnosis not present

## 2016-09-28 DIAGNOSIS — E039 Hypothyroidism, unspecified: Secondary | ICD-10-CM | POA: Diagnosis not present

## 2016-09-28 DIAGNOSIS — I480 Paroxysmal atrial fibrillation: Secondary | ICD-10-CM | POA: Diagnosis not present

## 2016-09-28 DIAGNOSIS — R935 Abnormal findings on diagnostic imaging of other abdominal regions, including retroperitoneum: Secondary | ICD-10-CM | POA: Diagnosis not present

## 2016-09-28 DIAGNOSIS — E119 Type 2 diabetes mellitus without complications: Secondary | ICD-10-CM | POA: Diagnosis not present

## 2016-09-28 DIAGNOSIS — I10 Essential (primary) hypertension: Secondary | ICD-10-CM | POA: Diagnosis not present

## 2016-09-28 DIAGNOSIS — N179 Acute kidney failure, unspecified: Secondary | ICD-10-CM | POA: Diagnosis not present

## 2016-09-28 DIAGNOSIS — Z8673 Personal history of transient ischemic attack (TIA), and cerebral infarction without residual deficits: Secondary | ICD-10-CM | POA: Diagnosis not present

## 2016-09-28 DIAGNOSIS — M1A9XX0 Chronic gout, unspecified, without tophus (tophi): Secondary | ICD-10-CM | POA: Diagnosis not present

## 2016-09-28 DIAGNOSIS — M79604 Pain in right leg: Secondary | ICD-10-CM | POA: Diagnosis not present

## 2016-09-28 DIAGNOSIS — Z7982 Long term (current) use of aspirin: Secondary | ICD-10-CM | POA: Diagnosis not present

## 2016-09-28 DIAGNOSIS — J441 Chronic obstructive pulmonary disease with (acute) exacerbation: Secondary | ICD-10-CM | POA: Diagnosis not present

## 2016-09-28 DIAGNOSIS — Z66 Do not resuscitate: Secondary | ICD-10-CM | POA: Diagnosis not present

## 2016-09-28 DIAGNOSIS — T45511A Poisoning by anticoagulants, accidental (unintentional), initial encounter: Secondary | ICD-10-CM | POA: Diagnosis not present

## 2016-09-28 DIAGNOSIS — G4733 Obstructive sleep apnea (adult) (pediatric): Secondary | ICD-10-CM | POA: Diagnosis not present

## 2016-09-28 DIAGNOSIS — I5032 Chronic diastolic (congestive) heart failure: Secondary | ICD-10-CM | POA: Diagnosis not present

## 2016-09-28 DIAGNOSIS — Z9989 Dependence on other enabling machines and devices: Secondary | ICD-10-CM | POA: Diagnosis not present

## 2016-09-28 DIAGNOSIS — Z7901 Long term (current) use of anticoagulants: Secondary | ICD-10-CM | POA: Diagnosis not present

## 2016-09-28 DIAGNOSIS — I482 Chronic atrial fibrillation: Secondary | ICD-10-CM | POA: Diagnosis not present

## 2016-09-28 DIAGNOSIS — D62 Acute posthemorrhagic anemia: Secondary | ICD-10-CM | POA: Diagnosis not present

## 2016-09-28 DIAGNOSIS — N390 Urinary tract infection, site not specified: Secondary | ICD-10-CM | POA: Diagnosis not present

## 2016-09-28 DIAGNOSIS — R5383 Other fatigue: Secondary | ICD-10-CM | POA: Diagnosis not present

## 2016-09-28 DIAGNOSIS — Z9119 Patient's noncompliance with other medical treatment and regimen: Secondary | ICD-10-CM | POA: Diagnosis not present

## 2016-09-28 DIAGNOSIS — I130 Hypertensive heart and chronic kidney disease with heart failure and stage 1 through stage 4 chronic kidney disease, or unspecified chronic kidney disease: Secondary | ICD-10-CM | POA: Diagnosis not present

## 2016-09-29 DIAGNOSIS — K922 Gastrointestinal hemorrhage, unspecified: Secondary | ICD-10-CM

## 2016-09-30 DIAGNOSIS — T45511A Poisoning by anticoagulants, accidental (unintentional), initial encounter: Secondary | ICD-10-CM | POA: Diagnosis not present

## 2016-09-30 DIAGNOSIS — K922 Gastrointestinal hemorrhage, unspecified: Secondary | ICD-10-CM | POA: Diagnosis not present

## 2016-09-30 DIAGNOSIS — D120 Benign neoplasm of cecum: Secondary | ICD-10-CM | POA: Diagnosis not present

## 2016-09-30 DIAGNOSIS — J159 Unspecified bacterial pneumonia: Secondary | ICD-10-CM | POA: Diagnosis not present

## 2016-09-30 DIAGNOSIS — G4733 Obstructive sleep apnea (adult) (pediatric): Secondary | ICD-10-CM | POA: Diagnosis not present

## 2016-09-30 DIAGNOSIS — D500 Iron deficiency anemia secondary to blood loss (chronic): Secondary | ICD-10-CM | POA: Diagnosis not present

## 2016-09-30 DIAGNOSIS — E611 Iron deficiency: Secondary | ICD-10-CM | POA: Diagnosis not present

## 2016-09-30 DIAGNOSIS — E119 Type 2 diabetes mellitus without complications: Secondary | ICD-10-CM | POA: Diagnosis not present

## 2016-09-30 DIAGNOSIS — K921 Melena: Secondary | ICD-10-CM | POA: Diagnosis not present

## 2016-09-30 DIAGNOSIS — D62 Acute posthemorrhagic anemia: Secondary | ICD-10-CM | POA: Diagnosis not present

## 2016-09-30 DIAGNOSIS — M1A9XX0 Chronic gout, unspecified, without tophus (tophi): Secondary | ICD-10-CM | POA: Diagnosis not present

## 2016-09-30 DIAGNOSIS — E039 Hypothyroidism, unspecified: Secondary | ICD-10-CM | POA: Diagnosis not present

## 2016-09-30 DIAGNOSIS — I5032 Chronic diastolic (congestive) heart failure: Secondary | ICD-10-CM | POA: Diagnosis not present

## 2016-09-30 DIAGNOSIS — E1142 Type 2 diabetes mellitus with diabetic polyneuropathy: Secondary | ICD-10-CM | POA: Diagnosis not present

## 2016-09-30 DIAGNOSIS — I480 Paroxysmal atrial fibrillation: Secondary | ICD-10-CM

## 2016-09-30 DIAGNOSIS — J441 Chronic obstructive pulmonary disease with (acute) exacerbation: Secondary | ICD-10-CM | POA: Diagnosis not present

## 2016-09-30 DIAGNOSIS — I10 Essential (primary) hypertension: Secondary | ICD-10-CM | POA: Diagnosis not present

## 2016-09-30 DIAGNOSIS — K2970 Gastritis, unspecified, without bleeding: Secondary | ICD-10-CM | POA: Diagnosis not present

## 2016-10-01 DIAGNOSIS — K922 Gastrointestinal hemorrhage, unspecified: Secondary | ICD-10-CM | POA: Diagnosis not present

## 2016-10-01 DIAGNOSIS — K2970 Gastritis, unspecified, without bleeding: Secondary | ICD-10-CM | POA: Diagnosis not present

## 2016-10-01 DIAGNOSIS — I5032 Chronic diastolic (congestive) heart failure: Secondary | ICD-10-CM | POA: Diagnosis not present

## 2016-10-01 DIAGNOSIS — G4733 Obstructive sleep apnea (adult) (pediatric): Secondary | ICD-10-CM | POA: Diagnosis not present

## 2016-10-01 DIAGNOSIS — I10 Essential (primary) hypertension: Secondary | ICD-10-CM | POA: Diagnosis not present

## 2016-10-01 DIAGNOSIS — D500 Iron deficiency anemia secondary to blood loss (chronic): Secondary | ICD-10-CM | POA: Diagnosis not present

## 2016-10-01 DIAGNOSIS — E119 Type 2 diabetes mellitus without complications: Secondary | ICD-10-CM | POA: Diagnosis not present

## 2016-10-01 DIAGNOSIS — E039 Hypothyroidism, unspecified: Secondary | ICD-10-CM | POA: Diagnosis not present

## 2016-10-01 DIAGNOSIS — T45511A Poisoning by anticoagulants, accidental (unintentional), initial encounter: Secondary | ICD-10-CM | POA: Diagnosis not present

## 2016-10-01 DIAGNOSIS — J159 Unspecified bacterial pneumonia: Secondary | ICD-10-CM | POA: Diagnosis not present

## 2016-10-01 DIAGNOSIS — J441 Chronic obstructive pulmonary disease with (acute) exacerbation: Secondary | ICD-10-CM | POA: Diagnosis not present

## 2016-10-01 DIAGNOSIS — M1A9XX0 Chronic gout, unspecified, without tophus (tophi): Secondary | ICD-10-CM | POA: Diagnosis not present

## 2016-10-01 DIAGNOSIS — D62 Acute posthemorrhagic anemia: Secondary | ICD-10-CM | POA: Diagnosis not present

## 2016-10-01 DIAGNOSIS — E611 Iron deficiency: Secondary | ICD-10-CM | POA: Diagnosis not present

## 2016-10-01 DIAGNOSIS — D120 Benign neoplasm of cecum: Secondary | ICD-10-CM | POA: Diagnosis not present

## 2016-10-01 DIAGNOSIS — K921 Melena: Secondary | ICD-10-CM | POA: Diagnosis not present

## 2016-10-01 DIAGNOSIS — E1142 Type 2 diabetes mellitus with diabetic polyneuropathy: Secondary | ICD-10-CM | POA: Diagnosis not present

## 2016-10-02 DIAGNOSIS — E119 Type 2 diabetes mellitus without complications: Secondary | ICD-10-CM | POA: Diagnosis not present

## 2016-10-02 DIAGNOSIS — K922 Gastrointestinal hemorrhage, unspecified: Secondary | ICD-10-CM | POA: Diagnosis not present

## 2016-10-02 DIAGNOSIS — I5032 Chronic diastolic (congestive) heart failure: Secondary | ICD-10-CM | POA: Diagnosis not present

## 2016-10-02 DIAGNOSIS — J159 Unspecified bacterial pneumonia: Secondary | ICD-10-CM | POA: Diagnosis not present

## 2016-10-02 DIAGNOSIS — E611 Iron deficiency: Secondary | ICD-10-CM | POA: Diagnosis not present

## 2016-10-02 DIAGNOSIS — D123 Benign neoplasm of transverse colon: Secondary | ICD-10-CM | POA: Diagnosis not present

## 2016-10-02 DIAGNOSIS — E1142 Type 2 diabetes mellitus with diabetic polyneuropathy: Secondary | ICD-10-CM | POA: Diagnosis not present

## 2016-10-02 DIAGNOSIS — I10 Essential (primary) hypertension: Secondary | ICD-10-CM | POA: Diagnosis not present

## 2016-10-02 DIAGNOSIS — D122 Benign neoplasm of ascending colon: Secondary | ICD-10-CM | POA: Diagnosis not present

## 2016-10-02 DIAGNOSIS — D62 Acute posthemorrhagic anemia: Secondary | ICD-10-CM | POA: Diagnosis not present

## 2016-10-02 DIAGNOSIS — K921 Melena: Secondary | ICD-10-CM | POA: Diagnosis not present

## 2016-10-02 DIAGNOSIS — D125 Benign neoplasm of sigmoid colon: Secondary | ICD-10-CM | POA: Diagnosis not present

## 2016-10-02 DIAGNOSIS — D124 Benign neoplasm of descending colon: Secondary | ICD-10-CM | POA: Diagnosis not present

## 2016-10-02 DIAGNOSIS — G4733 Obstructive sleep apnea (adult) (pediatric): Secondary | ICD-10-CM | POA: Diagnosis not present

## 2016-10-02 DIAGNOSIS — D120 Benign neoplasm of cecum: Secondary | ICD-10-CM | POA: Diagnosis not present

## 2016-10-02 DIAGNOSIS — T45511A Poisoning by anticoagulants, accidental (unintentional), initial encounter: Secondary | ICD-10-CM | POA: Diagnosis not present

## 2016-10-02 DIAGNOSIS — D500 Iron deficiency anemia secondary to blood loss (chronic): Secondary | ICD-10-CM | POA: Diagnosis not present

## 2016-10-02 DIAGNOSIS — E039 Hypothyroidism, unspecified: Secondary | ICD-10-CM | POA: Diagnosis not present

## 2016-10-02 DIAGNOSIS — M1A9XX0 Chronic gout, unspecified, without tophus (tophi): Secondary | ICD-10-CM | POA: Diagnosis not present

## 2016-10-02 DIAGNOSIS — J441 Chronic obstructive pulmonary disease with (acute) exacerbation: Secondary | ICD-10-CM | POA: Diagnosis not present

## 2016-10-02 DIAGNOSIS — K2970 Gastritis, unspecified, without bleeding: Secondary | ICD-10-CM | POA: Diagnosis not present

## 2016-10-03 DIAGNOSIS — D120 Benign neoplasm of cecum: Secondary | ICD-10-CM | POA: Diagnosis not present

## 2016-10-03 DIAGNOSIS — K922 Gastrointestinal hemorrhage, unspecified: Secondary | ICD-10-CM | POA: Diagnosis not present

## 2016-10-03 DIAGNOSIS — E1142 Type 2 diabetes mellitus with diabetic polyneuropathy: Secondary | ICD-10-CM | POA: Diagnosis not present

## 2016-10-03 DIAGNOSIS — T45511A Poisoning by anticoagulants, accidental (unintentional), initial encounter: Secondary | ICD-10-CM | POA: Diagnosis not present

## 2016-10-03 DIAGNOSIS — J159 Unspecified bacterial pneumonia: Secondary | ICD-10-CM | POA: Diagnosis not present

## 2016-10-03 DIAGNOSIS — K2970 Gastritis, unspecified, without bleeding: Secondary | ICD-10-CM

## 2016-10-03 DIAGNOSIS — E039 Hypothyroidism, unspecified: Secondary | ICD-10-CM | POA: Diagnosis not present

## 2016-10-03 DIAGNOSIS — I10 Essential (primary) hypertension: Secondary | ICD-10-CM | POA: Diagnosis not present

## 2016-10-03 DIAGNOSIS — D500 Iron deficiency anemia secondary to blood loss (chronic): Secondary | ICD-10-CM | POA: Diagnosis not present

## 2016-10-03 DIAGNOSIS — J441 Chronic obstructive pulmonary disease with (acute) exacerbation: Secondary | ICD-10-CM | POA: Diagnosis not present

## 2016-10-03 DIAGNOSIS — E611 Iron deficiency: Secondary | ICD-10-CM | POA: Diagnosis not present

## 2016-10-03 DIAGNOSIS — M1A9XX0 Chronic gout, unspecified, without tophus (tophi): Secondary | ICD-10-CM | POA: Diagnosis not present

## 2016-10-03 DIAGNOSIS — G4733 Obstructive sleep apnea (adult) (pediatric): Secondary | ICD-10-CM | POA: Diagnosis not present

## 2016-10-03 DIAGNOSIS — D62 Acute posthemorrhagic anemia: Secondary | ICD-10-CM | POA: Diagnosis not present

## 2016-10-03 DIAGNOSIS — I5032 Chronic diastolic (congestive) heart failure: Secondary | ICD-10-CM | POA: Diagnosis not present

## 2016-10-03 DIAGNOSIS — E119 Type 2 diabetes mellitus without complications: Secondary | ICD-10-CM | POA: Diagnosis not present

## 2016-10-03 DIAGNOSIS — K635 Polyp of colon: Secondary | ICD-10-CM

## 2016-10-03 DIAGNOSIS — K921 Melena: Secondary | ICD-10-CM | POA: Diagnosis not present

## 2016-10-04 DIAGNOSIS — E611 Iron deficiency: Secondary | ICD-10-CM | POA: Diagnosis not present

## 2016-10-04 DIAGNOSIS — Z9981 Dependence on supplemental oxygen: Secondary | ICD-10-CM | POA: Diagnosis not present

## 2016-10-04 DIAGNOSIS — D62 Acute posthemorrhagic anemia: Secondary | ICD-10-CM | POA: Diagnosis not present

## 2016-10-04 DIAGNOSIS — J159 Unspecified bacterial pneumonia: Secondary | ICD-10-CM | POA: Diagnosis not present

## 2016-10-04 DIAGNOSIS — R2681 Unsteadiness on feet: Secondary | ICD-10-CM | POA: Diagnosis not present

## 2016-10-04 DIAGNOSIS — G4733 Obstructive sleep apnea (adult) (pediatric): Secondary | ICD-10-CM | POA: Diagnosis not present

## 2016-10-04 DIAGNOSIS — T45511D Poisoning by anticoagulants, accidental (unintentional), subsequent encounter: Secondary | ICD-10-CM | POA: Diagnosis not present

## 2016-10-04 DIAGNOSIS — D120 Benign neoplasm of cecum: Secondary | ICD-10-CM | POA: Diagnosis not present

## 2016-10-04 DIAGNOSIS — M6281 Muscle weakness (generalized): Secondary | ICD-10-CM | POA: Diagnosis not present

## 2016-10-04 DIAGNOSIS — J441 Chronic obstructive pulmonary disease with (acute) exacerbation: Secondary | ICD-10-CM | POA: Diagnosis not present

## 2016-10-04 DIAGNOSIS — R5383 Other fatigue: Secondary | ICD-10-CM | POA: Diagnosis not present

## 2016-10-04 DIAGNOSIS — M1A9XX0 Chronic gout, unspecified, without tophus (tophi): Secondary | ICD-10-CM | POA: Diagnosis not present

## 2016-10-04 DIAGNOSIS — I10 Essential (primary) hypertension: Secondary | ICD-10-CM | POA: Diagnosis not present

## 2016-10-04 DIAGNOSIS — R278 Other lack of coordination: Secondary | ICD-10-CM | POA: Diagnosis not present

## 2016-10-04 DIAGNOSIS — Z7401 Bed confinement status: Secondary | ICD-10-CM | POA: Diagnosis not present

## 2016-10-04 DIAGNOSIS — K921 Melena: Secondary | ICD-10-CM | POA: Diagnosis not present

## 2016-10-04 DIAGNOSIS — R2689 Other abnormalities of gait and mobility: Secondary | ICD-10-CM | POA: Diagnosis not present

## 2016-10-04 DIAGNOSIS — D500 Iron deficiency anemia secondary to blood loss (chronic): Secondary | ICD-10-CM | POA: Diagnosis not present

## 2016-10-04 DIAGNOSIS — E119 Type 2 diabetes mellitus without complications: Secondary | ICD-10-CM | POA: Diagnosis not present

## 2016-10-04 DIAGNOSIS — T45511A Poisoning by anticoagulants, accidental (unintentional), initial encounter: Secondary | ICD-10-CM | POA: Diagnosis not present

## 2016-10-04 DIAGNOSIS — I5032 Chronic diastolic (congestive) heart failure: Secondary | ICD-10-CM | POA: Diagnosis not present

## 2016-10-04 DIAGNOSIS — K2970 Gastritis, unspecified, without bleeding: Secondary | ICD-10-CM | POA: Diagnosis not present

## 2016-10-04 DIAGNOSIS — E1142 Type 2 diabetes mellitus with diabetic polyneuropathy: Secondary | ICD-10-CM | POA: Diagnosis not present

## 2016-10-04 DIAGNOSIS — E039 Hypothyroidism, unspecified: Secondary | ICD-10-CM | POA: Diagnosis not present

## 2016-10-04 DIAGNOSIS — K922 Gastrointestinal hemorrhage, unspecified: Secondary | ICD-10-CM | POA: Diagnosis not present

## 2016-10-09 DIAGNOSIS — Z7901 Long term (current) use of anticoagulants: Secondary | ICD-10-CM | POA: Diagnosis not present

## 2016-10-20 DIAGNOSIS — Z7901 Long term (current) use of anticoagulants: Secondary | ICD-10-CM | POA: Diagnosis not present

## 2016-10-20 DIAGNOSIS — J441 Chronic obstructive pulmonary disease with (acute) exacerbation: Secondary | ICD-10-CM | POA: Diagnosis not present

## 2016-10-22 DIAGNOSIS — Z794 Long term (current) use of insulin: Secondary | ICD-10-CM | POA: Diagnosis not present

## 2016-10-22 DIAGNOSIS — G4733 Obstructive sleep apnea (adult) (pediatric): Secondary | ICD-10-CM | POA: Diagnosis not present

## 2016-10-22 DIAGNOSIS — J441 Chronic obstructive pulmonary disease with (acute) exacerbation: Secondary | ICD-10-CM | POA: Diagnosis not present

## 2016-10-22 DIAGNOSIS — F418 Other specified anxiety disorders: Secondary | ICD-10-CM | POA: Diagnosis not present

## 2016-10-22 DIAGNOSIS — N3946 Mixed incontinence: Secondary | ICD-10-CM | POA: Diagnosis not present

## 2016-10-22 DIAGNOSIS — E1142 Type 2 diabetes mellitus with diabetic polyneuropathy: Secondary | ICD-10-CM | POA: Diagnosis not present

## 2016-10-22 DIAGNOSIS — I130 Hypertensive heart and chronic kidney disease with heart failure and stage 1 through stage 4 chronic kidney disease, or unspecified chronic kidney disease: Secondary | ICD-10-CM | POA: Diagnosis not present

## 2016-10-22 DIAGNOSIS — E1122 Type 2 diabetes mellitus with diabetic chronic kidney disease: Secondary | ICD-10-CM | POA: Diagnosis not present

## 2016-10-22 DIAGNOSIS — I480 Paroxysmal atrial fibrillation: Secondary | ICD-10-CM | POA: Diagnosis not present

## 2016-10-22 DIAGNOSIS — Z87440 Personal history of urinary (tract) infections: Secondary | ICD-10-CM | POA: Diagnosis not present

## 2016-10-22 DIAGNOSIS — K922 Gastrointestinal hemorrhage, unspecified: Secondary | ICD-10-CM | POA: Diagnosis not present

## 2016-10-22 DIAGNOSIS — N183 Chronic kidney disease, stage 3 (moderate): Secondary | ICD-10-CM | POA: Diagnosis not present

## 2016-10-22 DIAGNOSIS — G4700 Insomnia, unspecified: Secondary | ICD-10-CM | POA: Diagnosis not present

## 2016-10-22 DIAGNOSIS — I5032 Chronic diastolic (congestive) heart failure: Secondary | ICD-10-CM | POA: Diagnosis not present

## 2016-10-22 DIAGNOSIS — M1A9XX0 Chronic gout, unspecified, without tophus (tophi): Secondary | ICD-10-CM | POA: Diagnosis not present

## 2016-10-22 DIAGNOSIS — Z7982 Long term (current) use of aspirin: Secondary | ICD-10-CM | POA: Diagnosis not present

## 2016-10-27 DIAGNOSIS — K591 Functional diarrhea: Secondary | ICD-10-CM | POA: Diagnosis not present

## 2016-10-27 DIAGNOSIS — D510 Vitamin B12 deficiency anemia due to intrinsic factor deficiency: Secondary | ICD-10-CM | POA: Diagnosis not present

## 2016-10-27 DIAGNOSIS — D649 Anemia, unspecified: Secondary | ICD-10-CM | POA: Diagnosis not present

## 2016-10-27 DIAGNOSIS — D126 Benign neoplasm of colon, unspecified: Secondary | ICD-10-CM | POA: Diagnosis not present

## 2016-10-27 DIAGNOSIS — K921 Melena: Secondary | ICD-10-CM | POA: Diagnosis not present

## 2016-11-02 DIAGNOSIS — Z7901 Long term (current) use of anticoagulants: Secondary | ICD-10-CM | POA: Diagnosis not present

## 2016-11-04 DIAGNOSIS — I5032 Chronic diastolic (congestive) heart failure: Secondary | ICD-10-CM | POA: Diagnosis not present

## 2016-11-04 DIAGNOSIS — E1122 Type 2 diabetes mellitus with diabetic chronic kidney disease: Secondary | ICD-10-CM | POA: Diagnosis not present

## 2016-11-04 DIAGNOSIS — N183 Chronic kidney disease, stage 3 (moderate): Secondary | ICD-10-CM | POA: Diagnosis not present

## 2016-11-04 DIAGNOSIS — I130 Hypertensive heart and chronic kidney disease with heart failure and stage 1 through stage 4 chronic kidney disease, or unspecified chronic kidney disease: Secondary | ICD-10-CM | POA: Diagnosis not present

## 2016-11-10 DIAGNOSIS — Z7901 Long term (current) use of anticoagulants: Secondary | ICD-10-CM | POA: Diagnosis not present

## 2016-11-17 DIAGNOSIS — Z7901 Long term (current) use of anticoagulants: Secondary | ICD-10-CM | POA: Diagnosis not present

## 2016-12-08 DIAGNOSIS — I5032 Chronic diastolic (congestive) heart failure: Secondary | ICD-10-CM | POA: Diagnosis not present

## 2016-12-24 DIAGNOSIS — I5032 Chronic diastolic (congestive) heart failure: Secondary | ICD-10-CM | POA: Diagnosis not present

## 2016-12-24 DIAGNOSIS — G4700 Insomnia, unspecified: Secondary | ICD-10-CM | POA: Diagnosis not present

## 2016-12-24 DIAGNOSIS — G4733 Obstructive sleep apnea (adult) (pediatric): Secondary | ICD-10-CM | POA: Diagnosis not present

## 2016-12-24 DIAGNOSIS — E1142 Type 2 diabetes mellitus with diabetic polyneuropathy: Secondary | ICD-10-CM | POA: Diagnosis not present

## 2016-12-24 DIAGNOSIS — N183 Chronic kidney disease, stage 3 (moderate): Secondary | ICD-10-CM | POA: Diagnosis not present

## 2016-12-24 DIAGNOSIS — Z794 Long term (current) use of insulin: Secondary | ICD-10-CM | POA: Diagnosis not present

## 2016-12-24 DIAGNOSIS — Z87440 Personal history of urinary (tract) infections: Secondary | ICD-10-CM | POA: Diagnosis not present

## 2016-12-24 DIAGNOSIS — Z7982 Long term (current) use of aspirin: Secondary | ICD-10-CM | POA: Diagnosis not present

## 2016-12-24 DIAGNOSIS — I130 Hypertensive heart and chronic kidney disease with heart failure and stage 1 through stage 4 chronic kidney disease, or unspecified chronic kidney disease: Secondary | ICD-10-CM | POA: Diagnosis not present

## 2016-12-24 DIAGNOSIS — I480 Paroxysmal atrial fibrillation: Secondary | ICD-10-CM | POA: Diagnosis not present

## 2016-12-24 DIAGNOSIS — E1122 Type 2 diabetes mellitus with diabetic chronic kidney disease: Secondary | ICD-10-CM | POA: Diagnosis not present

## 2016-12-24 DIAGNOSIS — J441 Chronic obstructive pulmonary disease with (acute) exacerbation: Secondary | ICD-10-CM | POA: Diagnosis not present

## 2016-12-24 DIAGNOSIS — F418 Other specified anxiety disorders: Secondary | ICD-10-CM | POA: Diagnosis not present

## 2016-12-24 DIAGNOSIS — M1A9XX0 Chronic gout, unspecified, without tophus (tophi): Secondary | ICD-10-CM | POA: Diagnosis not present

## 2016-12-29 DIAGNOSIS — Z7901 Long term (current) use of anticoagulants: Secondary | ICD-10-CM | POA: Diagnosis not present

## 2017-01-04 DIAGNOSIS — M19042 Primary osteoarthritis, left hand: Secondary | ICD-10-CM | POA: Diagnosis not present

## 2017-01-04 DIAGNOSIS — N3000 Acute cystitis without hematuria: Secondary | ICD-10-CM | POA: Diagnosis not present

## 2017-01-04 DIAGNOSIS — N390 Urinary tract infection, site not specified: Secondary | ICD-10-CM | POA: Diagnosis not present

## 2017-01-07 DIAGNOSIS — I5032 Chronic diastolic (congestive) heart failure: Secondary | ICD-10-CM | POA: Diagnosis not present

## 2017-01-12 DIAGNOSIS — J441 Chronic obstructive pulmonary disease with (acute) exacerbation: Secondary | ICD-10-CM | POA: Diagnosis not present

## 2017-01-12 DIAGNOSIS — G4700 Insomnia, unspecified: Secondary | ICD-10-CM | POA: Diagnosis not present

## 2017-01-12 DIAGNOSIS — F418 Other specified anxiety disorders: Secondary | ICD-10-CM | POA: Diagnosis not present

## 2017-01-12 DIAGNOSIS — Z87440 Personal history of urinary (tract) infections: Secondary | ICD-10-CM | POA: Diagnosis not present

## 2017-01-12 DIAGNOSIS — N183 Chronic kidney disease, stage 3 (moderate): Secondary | ICD-10-CM | POA: Diagnosis not present

## 2017-01-12 DIAGNOSIS — I5032 Chronic diastolic (congestive) heart failure: Secondary | ICD-10-CM | POA: Diagnosis not present

## 2017-01-12 DIAGNOSIS — I480 Paroxysmal atrial fibrillation: Secondary | ICD-10-CM | POA: Diagnosis not present

## 2017-01-12 DIAGNOSIS — M1A9XX0 Chronic gout, unspecified, without tophus (tophi): Secondary | ICD-10-CM | POA: Diagnosis not present

## 2017-01-12 DIAGNOSIS — E1122 Type 2 diabetes mellitus with diabetic chronic kidney disease: Secondary | ICD-10-CM | POA: Diagnosis not present

## 2017-01-12 DIAGNOSIS — G4733 Obstructive sleep apnea (adult) (pediatric): Secondary | ICD-10-CM | POA: Diagnosis not present

## 2017-01-12 DIAGNOSIS — E1142 Type 2 diabetes mellitus with diabetic polyneuropathy: Secondary | ICD-10-CM | POA: Diagnosis not present

## 2017-01-12 DIAGNOSIS — I130 Hypertensive heart and chronic kidney disease with heart failure and stage 1 through stage 4 chronic kidney disease, or unspecified chronic kidney disease: Secondary | ICD-10-CM | POA: Diagnosis not present

## 2017-01-12 DIAGNOSIS — Z794 Long term (current) use of insulin: Secondary | ICD-10-CM | POA: Diagnosis not present

## 2017-01-12 DIAGNOSIS — Z7982 Long term (current) use of aspirin: Secondary | ICD-10-CM | POA: Diagnosis not present

## 2017-01-20 DIAGNOSIS — Z7901 Long term (current) use of anticoagulants: Secondary | ICD-10-CM | POA: Diagnosis not present

## 2017-01-27 DIAGNOSIS — Z7901 Long term (current) use of anticoagulants: Secondary | ICD-10-CM | POA: Diagnosis not present

## 2017-01-27 DIAGNOSIS — I5032 Chronic diastolic (congestive) heart failure: Secondary | ICD-10-CM | POA: Diagnosis not present

## 2017-01-27 DIAGNOSIS — E1122 Type 2 diabetes mellitus with diabetic chronic kidney disease: Secondary | ICD-10-CM | POA: Diagnosis not present

## 2017-01-27 DIAGNOSIS — N183 Chronic kidney disease, stage 3 (moderate): Secondary | ICD-10-CM | POA: Diagnosis not present

## 2017-01-27 DIAGNOSIS — I130 Hypertensive heart and chronic kidney disease with heart failure and stage 1 through stage 4 chronic kidney disease, or unspecified chronic kidney disease: Secondary | ICD-10-CM | POA: Diagnosis not present

## 2017-02-03 DIAGNOSIS — Z7901 Long term (current) use of anticoagulants: Secondary | ICD-10-CM | POA: Diagnosis not present

## 2017-02-04 DIAGNOSIS — I10 Essential (primary) hypertension: Secondary | ICD-10-CM | POA: Diagnosis not present

## 2017-02-04 DIAGNOSIS — Z794 Long term (current) use of insulin: Secondary | ICD-10-CM | POA: Diagnosis not present

## 2017-02-04 DIAGNOSIS — E119 Type 2 diabetes mellitus without complications: Secondary | ICD-10-CM | POA: Diagnosis not present

## 2017-02-04 DIAGNOSIS — E1142 Type 2 diabetes mellitus with diabetic polyneuropathy: Secondary | ICD-10-CM | POA: Diagnosis not present

## 2017-02-04 DIAGNOSIS — Z87440 Personal history of urinary (tract) infections: Secondary | ICD-10-CM | POA: Diagnosis not present

## 2017-02-04 DIAGNOSIS — E1122 Type 2 diabetes mellitus with diabetic chronic kidney disease: Secondary | ICD-10-CM | POA: Diagnosis not present

## 2017-02-04 DIAGNOSIS — E860 Dehydration: Secondary | ICD-10-CM | POA: Diagnosis not present

## 2017-02-04 DIAGNOSIS — Z888 Allergy status to other drugs, medicaments and biological substances status: Secondary | ICD-10-CM | POA: Diagnosis not present

## 2017-02-04 DIAGNOSIS — T383X5A Adverse effect of insulin and oral hypoglycemic [antidiabetic] drugs, initial encounter: Secondary | ICD-10-CM | POA: Diagnosis not present

## 2017-02-04 DIAGNOSIS — J449 Chronic obstructive pulmonary disease, unspecified: Secondary | ICD-10-CM | POA: Diagnosis not present

## 2017-02-04 DIAGNOSIS — E162 Hypoglycemia, unspecified: Secondary | ICD-10-CM | POA: Diagnosis not present

## 2017-02-04 DIAGNOSIS — E160 Drug-induced hypoglycemia without coma: Secondary | ICD-10-CM | POA: Diagnosis not present

## 2017-02-04 DIAGNOSIS — E875 Hyperkalemia: Secondary | ICD-10-CM | POA: Diagnosis not present

## 2017-02-04 DIAGNOSIS — I480 Paroxysmal atrial fibrillation: Secondary | ICD-10-CM | POA: Diagnosis not present

## 2017-02-04 DIAGNOSIS — N179 Acute kidney failure, unspecified: Secondary | ICD-10-CM | POA: Diagnosis not present

## 2017-02-04 DIAGNOSIS — R001 Bradycardia, unspecified: Secondary | ICD-10-CM | POA: Diagnosis not present

## 2017-02-04 DIAGNOSIS — R55 Syncope and collapse: Secondary | ICD-10-CM | POA: Diagnosis not present

## 2017-02-04 DIAGNOSIS — E11649 Type 2 diabetes mellitus with hypoglycemia without coma: Secondary | ICD-10-CM | POA: Diagnosis not present

## 2017-02-04 DIAGNOSIS — I2510 Atherosclerotic heart disease of native coronary artery without angina pectoris: Secondary | ICD-10-CM | POA: Diagnosis not present

## 2017-02-04 DIAGNOSIS — M109 Gout, unspecified: Secondary | ICD-10-CM | POA: Diagnosis not present

## 2017-02-04 DIAGNOSIS — J9690 Respiratory failure, unspecified, unspecified whether with hypoxia or hypercapnia: Secondary | ICD-10-CM | POA: Diagnosis not present

## 2017-02-04 DIAGNOSIS — B9620 Unspecified Escherichia coli [E. coli] as the cause of diseases classified elsewhere: Secondary | ICD-10-CM | POA: Diagnosis not present

## 2017-02-04 DIAGNOSIS — E039 Hypothyroidism, unspecified: Secondary | ICD-10-CM | POA: Diagnosis not present

## 2017-02-04 DIAGNOSIS — M1990 Unspecified osteoarthritis, unspecified site: Secondary | ICD-10-CM | POA: Diagnosis not present

## 2017-02-04 DIAGNOSIS — I272 Pulmonary hypertension, unspecified: Secondary | ICD-10-CM | POA: Diagnosis not present

## 2017-02-04 DIAGNOSIS — E7800 Pure hypercholesterolemia, unspecified: Secondary | ICD-10-CM | POA: Diagnosis not present

## 2017-02-04 DIAGNOSIS — N390 Urinary tract infection, site not specified: Secondary | ICD-10-CM | POA: Diagnosis not present

## 2017-02-04 DIAGNOSIS — E161 Other hypoglycemia: Secondary | ICD-10-CM | POA: Diagnosis not present

## 2017-02-04 DIAGNOSIS — N183 Chronic kidney disease, stage 3 (moderate): Secondary | ICD-10-CM | POA: Diagnosis not present

## 2017-02-04 DIAGNOSIS — I130 Hypertensive heart and chronic kidney disease with heart failure and stage 1 through stage 4 chronic kidney disease, or unspecified chronic kidney disease: Secondary | ICD-10-CM | POA: Diagnosis not present

## 2017-02-04 DIAGNOSIS — I5032 Chronic diastolic (congestive) heart failure: Secondary | ICD-10-CM | POA: Diagnosis not present

## 2017-02-04 DIAGNOSIS — N289 Disorder of kidney and ureter, unspecified: Secondary | ICD-10-CM | POA: Diagnosis not present

## 2017-02-04 DIAGNOSIS — G4733 Obstructive sleep apnea (adult) (pediatric): Secondary | ICD-10-CM | POA: Diagnosis not present

## 2017-02-14 DIAGNOSIS — N170 Acute kidney failure with tubular necrosis: Secondary | ICD-10-CM | POA: Diagnosis not present

## 2017-02-14 DIAGNOSIS — E11641 Type 2 diabetes mellitus with hypoglycemia with coma: Secondary | ICD-10-CM | POA: Diagnosis not present

## 2017-03-05 DIAGNOSIS — I5032 Chronic diastolic (congestive) heart failure: Secondary | ICD-10-CM | POA: Diagnosis not present

## 2017-03-05 DIAGNOSIS — N184 Chronic kidney disease, stage 4 (severe): Secondary | ICD-10-CM | POA: Diagnosis not present

## 2017-03-05 DIAGNOSIS — R000 Tachycardia, unspecified: Secondary | ICD-10-CM | POA: Diagnosis not present

## 2017-03-05 DIAGNOSIS — I4891 Unspecified atrial fibrillation: Secondary | ICD-10-CM | POA: Diagnosis not present

## 2017-03-05 DIAGNOSIS — R079 Chest pain, unspecified: Secondary | ICD-10-CM | POA: Diagnosis not present

## 2017-03-05 DIAGNOSIS — E119 Type 2 diabetes mellitus without complications: Secondary | ICD-10-CM | POA: Diagnosis not present

## 2017-03-05 DIAGNOSIS — I10 Essential (primary) hypertension: Secondary | ICD-10-CM | POA: Diagnosis not present

## 2017-03-05 DIAGNOSIS — D649 Anemia, unspecified: Secondary | ICD-10-CM | POA: Diagnosis not present

## 2017-03-05 DIAGNOSIS — E039 Hypothyroidism, unspecified: Secondary | ICD-10-CM | POA: Diagnosis not present

## 2017-03-06 DIAGNOSIS — E039 Hypothyroidism, unspecified: Secondary | ICD-10-CM | POA: Diagnosis not present

## 2017-03-06 DIAGNOSIS — N184 Chronic kidney disease, stage 4 (severe): Secondary | ICD-10-CM | POA: Diagnosis not present

## 2017-03-06 DIAGNOSIS — E119 Type 2 diabetes mellitus without complications: Secondary | ICD-10-CM | POA: Diagnosis not present

## 2017-03-06 DIAGNOSIS — D649 Anemia, unspecified: Secondary | ICD-10-CM | POA: Diagnosis not present

## 2017-03-06 DIAGNOSIS — E1122 Type 2 diabetes mellitus with diabetic chronic kidney disease: Secondary | ICD-10-CM | POA: Diagnosis not present

## 2017-03-06 DIAGNOSIS — I130 Hypertensive heart and chronic kidney disease with heart failure and stage 1 through stage 4 chronic kidney disease, or unspecified chronic kidney disease: Secondary | ICD-10-CM | POA: Diagnosis not present

## 2017-03-06 DIAGNOSIS — I10 Essential (primary) hypertension: Secondary | ICD-10-CM | POA: Diagnosis not present

## 2017-03-06 DIAGNOSIS — I2510 Atherosclerotic heart disease of native coronary artery without angina pectoris: Secondary | ICD-10-CM | POA: Diagnosis not present

## 2017-03-06 DIAGNOSIS — E7800 Pure hypercholesterolemia, unspecified: Secondary | ICD-10-CM | POA: Diagnosis not present

## 2017-03-06 DIAGNOSIS — J449 Chronic obstructive pulmonary disease, unspecified: Secondary | ICD-10-CM | POA: Diagnosis not present

## 2017-03-06 DIAGNOSIS — M1990 Unspecified osteoarthritis, unspecified site: Secondary | ICD-10-CM | POA: Diagnosis not present

## 2017-03-06 DIAGNOSIS — Z7982 Long term (current) use of aspirin: Secondary | ICD-10-CM | POA: Diagnosis not present

## 2017-03-06 DIAGNOSIS — N179 Acute kidney failure, unspecified: Secondary | ICD-10-CM | POA: Diagnosis not present

## 2017-03-06 DIAGNOSIS — Z794 Long term (current) use of insulin: Secondary | ICD-10-CM | POA: Diagnosis not present

## 2017-03-06 DIAGNOSIS — M109 Gout, unspecified: Secondary | ICD-10-CM | POA: Diagnosis not present

## 2017-03-06 DIAGNOSIS — E860 Dehydration: Secondary | ICD-10-CM | POA: Diagnosis not present

## 2017-03-06 DIAGNOSIS — I4891 Unspecified atrial fibrillation: Secondary | ICD-10-CM | POA: Diagnosis not present

## 2017-03-06 DIAGNOSIS — Z888 Allergy status to other drugs, medicaments and biological substances status: Secondary | ICD-10-CM | POA: Diagnosis not present

## 2017-03-06 DIAGNOSIS — R079 Chest pain, unspecified: Secondary | ICD-10-CM | POA: Diagnosis not present

## 2017-03-06 DIAGNOSIS — I5032 Chronic diastolic (congestive) heart failure: Secondary | ICD-10-CM | POA: Diagnosis not present

## 2017-03-06 DIAGNOSIS — G4733 Obstructive sleep apnea (adult) (pediatric): Secondary | ICD-10-CM | POA: Diagnosis not present

## 2017-03-06 DIAGNOSIS — I272 Pulmonary hypertension, unspecified: Secondary | ICD-10-CM | POA: Diagnosis not present

## 2017-03-06 DIAGNOSIS — D631 Anemia in chronic kidney disease: Secondary | ICD-10-CM | POA: Diagnosis not present

## 2017-03-06 DIAGNOSIS — Z79899 Other long term (current) drug therapy: Secondary | ICD-10-CM | POA: Diagnosis not present

## 2017-03-06 DIAGNOSIS — R000 Tachycardia, unspecified: Secondary | ICD-10-CM | POA: Diagnosis not present

## 2017-03-06 DIAGNOSIS — Z7901 Long term (current) use of anticoagulants: Secondary | ICD-10-CM | POA: Diagnosis not present

## 2017-03-10 DIAGNOSIS — I5032 Chronic diastolic (congestive) heart failure: Secondary | ICD-10-CM | POA: Diagnosis not present

## 2017-03-11 DIAGNOSIS — E1149 Type 2 diabetes mellitus with other diabetic neurological complication: Secondary | ICD-10-CM | POA: Diagnosis not present

## 2017-03-11 DIAGNOSIS — I5032 Chronic diastolic (congestive) heart failure: Secondary | ICD-10-CM | POA: Diagnosis not present

## 2017-03-11 DIAGNOSIS — E11649 Type 2 diabetes mellitus with hypoglycemia without coma: Secondary | ICD-10-CM | POA: Diagnosis not present

## 2017-03-11 DIAGNOSIS — I481 Persistent atrial fibrillation: Secondary | ICD-10-CM | POA: Diagnosis not present

## 2017-03-11 DIAGNOSIS — N184 Chronic kidney disease, stage 4 (severe): Secondary | ICD-10-CM | POA: Diagnosis not present

## 2017-03-11 DIAGNOSIS — Z23 Encounter for immunization: Secondary | ICD-10-CM | POA: Diagnosis not present

## 2017-03-13 DIAGNOSIS — J441 Chronic obstructive pulmonary disease with (acute) exacerbation: Secondary | ICD-10-CM | POA: Diagnosis not present

## 2017-03-13 DIAGNOSIS — N183 Chronic kidney disease, stage 3 (moderate): Secondary | ICD-10-CM | POA: Diagnosis not present

## 2017-03-13 DIAGNOSIS — F418 Other specified anxiety disorders: Secondary | ICD-10-CM | POA: Diagnosis not present

## 2017-03-13 DIAGNOSIS — Z87440 Personal history of urinary (tract) infections: Secondary | ICD-10-CM | POA: Diagnosis not present

## 2017-03-13 DIAGNOSIS — G4733 Obstructive sleep apnea (adult) (pediatric): Secondary | ICD-10-CM | POA: Diagnosis not present

## 2017-03-13 DIAGNOSIS — I480 Paroxysmal atrial fibrillation: Secondary | ICD-10-CM | POA: Diagnosis not present

## 2017-03-13 DIAGNOSIS — G4700 Insomnia, unspecified: Secondary | ICD-10-CM | POA: Diagnosis not present

## 2017-03-13 DIAGNOSIS — E1122 Type 2 diabetes mellitus with diabetic chronic kidney disease: Secondary | ICD-10-CM | POA: Diagnosis not present

## 2017-03-13 DIAGNOSIS — Z794 Long term (current) use of insulin: Secondary | ICD-10-CM | POA: Diagnosis not present

## 2017-03-13 DIAGNOSIS — Z7982 Long term (current) use of aspirin: Secondary | ICD-10-CM | POA: Diagnosis not present

## 2017-03-13 DIAGNOSIS — M1A9XX0 Chronic gout, unspecified, without tophus (tophi): Secondary | ICD-10-CM | POA: Diagnosis not present

## 2017-03-13 DIAGNOSIS — I130 Hypertensive heart and chronic kidney disease with heart failure and stage 1 through stage 4 chronic kidney disease, or unspecified chronic kidney disease: Secondary | ICD-10-CM | POA: Diagnosis not present

## 2017-03-13 DIAGNOSIS — E039 Hypothyroidism, unspecified: Secondary | ICD-10-CM | POA: Diagnosis not present

## 2017-03-13 DIAGNOSIS — E1142 Type 2 diabetes mellitus with diabetic polyneuropathy: Secondary | ICD-10-CM | POA: Diagnosis not present

## 2017-03-13 DIAGNOSIS — I5032 Chronic diastolic (congestive) heart failure: Secondary | ICD-10-CM | POA: Diagnosis not present

## 2017-03-23 DIAGNOSIS — Z5181 Encounter for therapeutic drug level monitoring: Secondary | ICD-10-CM | POA: Diagnosis not present

## 2017-04-09 DIAGNOSIS — I5032 Chronic diastolic (congestive) heart failure: Secondary | ICD-10-CM | POA: Diagnosis not present

## 2017-04-20 DIAGNOSIS — I480 Paroxysmal atrial fibrillation: Secondary | ICD-10-CM | POA: Diagnosis not present

## 2017-05-10 DIAGNOSIS — I5032 Chronic diastolic (congestive) heart failure: Secondary | ICD-10-CM | POA: Diagnosis not present

## 2017-05-12 DIAGNOSIS — F418 Other specified anxiety disorders: Secondary | ICD-10-CM | POA: Diagnosis not present

## 2017-05-12 DIAGNOSIS — M1A9XX0 Chronic gout, unspecified, without tophus (tophi): Secondary | ICD-10-CM | POA: Diagnosis not present

## 2017-05-12 DIAGNOSIS — I5032 Chronic diastolic (congestive) heart failure: Secondary | ICD-10-CM | POA: Diagnosis not present

## 2017-05-12 DIAGNOSIS — G4733 Obstructive sleep apnea (adult) (pediatric): Secondary | ICD-10-CM | POA: Diagnosis not present

## 2017-05-12 DIAGNOSIS — E1122 Type 2 diabetes mellitus with diabetic chronic kidney disease: Secondary | ICD-10-CM | POA: Diagnosis not present

## 2017-05-12 DIAGNOSIS — N183 Chronic kidney disease, stage 3 (moderate): Secondary | ICD-10-CM | POA: Diagnosis not present

## 2017-05-12 DIAGNOSIS — G4700 Insomnia, unspecified: Secondary | ICD-10-CM | POA: Diagnosis not present

## 2017-05-12 DIAGNOSIS — E1142 Type 2 diabetes mellitus with diabetic polyneuropathy: Secondary | ICD-10-CM | POA: Diagnosis not present

## 2017-05-12 DIAGNOSIS — I480 Paroxysmal atrial fibrillation: Secondary | ICD-10-CM | POA: Diagnosis not present

## 2017-05-12 DIAGNOSIS — Z87440 Personal history of urinary (tract) infections: Secondary | ICD-10-CM | POA: Diagnosis not present

## 2017-05-12 DIAGNOSIS — Z794 Long term (current) use of insulin: Secondary | ICD-10-CM | POA: Diagnosis not present

## 2017-05-12 DIAGNOSIS — E039 Hypothyroidism, unspecified: Secondary | ICD-10-CM | POA: Diagnosis not present

## 2017-05-12 DIAGNOSIS — I130 Hypertensive heart and chronic kidney disease with heart failure and stage 1 through stage 4 chronic kidney disease, or unspecified chronic kidney disease: Secondary | ICD-10-CM | POA: Diagnosis not present

## 2017-05-12 DIAGNOSIS — J441 Chronic obstructive pulmonary disease with (acute) exacerbation: Secondary | ICD-10-CM | POA: Diagnosis not present

## 2017-05-12 DIAGNOSIS — Z7982 Long term (current) use of aspirin: Secondary | ICD-10-CM | POA: Diagnosis not present

## 2017-05-19 DIAGNOSIS — R791 Abnormal coagulation profile: Secondary | ICD-10-CM | POA: Diagnosis not present

## 2017-06-03 DIAGNOSIS — E782 Mixed hyperlipidemia: Secondary | ICD-10-CM | POA: Diagnosis not present

## 2017-06-03 DIAGNOSIS — I481 Persistent atrial fibrillation: Secondary | ICD-10-CM | POA: Diagnosis not present

## 2017-06-03 DIAGNOSIS — I10 Essential (primary) hypertension: Secondary | ICD-10-CM | POA: Diagnosis not present

## 2017-06-03 DIAGNOSIS — E1142 Type 2 diabetes mellitus with diabetic polyneuropathy: Secondary | ICD-10-CM | POA: Diagnosis not present

## 2017-06-03 DIAGNOSIS — G4733 Obstructive sleep apnea (adult) (pediatric): Secondary | ICD-10-CM | POA: Diagnosis not present

## 2017-06-03 DIAGNOSIS — E1129 Type 2 diabetes mellitus with other diabetic kidney complication: Secondary | ICD-10-CM | POA: Diagnosis not present

## 2017-06-03 DIAGNOSIS — I5032 Chronic diastolic (congestive) heart failure: Secondary | ICD-10-CM | POA: Diagnosis not present

## 2017-06-03 DIAGNOSIS — E1149 Type 2 diabetes mellitus with other diabetic neurological complication: Secondary | ICD-10-CM | POA: Diagnosis not present

## 2017-06-03 DIAGNOSIS — Z7901 Long term (current) use of anticoagulants: Secondary | ICD-10-CM | POA: Diagnosis not present

## 2017-06-09 DIAGNOSIS — I5032 Chronic diastolic (congestive) heart failure: Secondary | ICD-10-CM | POA: Diagnosis not present

## 2017-07-10 DIAGNOSIS — I5032 Chronic diastolic (congestive) heart failure: Secondary | ICD-10-CM | POA: Diagnosis not present

## 2017-07-11 DIAGNOSIS — N390 Urinary tract infection, site not specified: Secondary | ICD-10-CM | POA: Diagnosis not present

## 2017-07-11 DIAGNOSIS — Z7901 Long term (current) use of anticoagulants: Secondary | ICD-10-CM | POA: Diagnosis not present

## 2017-07-11 DIAGNOSIS — R600 Localized edema: Secondary | ICD-10-CM | POA: Diagnosis not present

## 2017-08-10 DIAGNOSIS — I5032 Chronic diastolic (congestive) heart failure: Secondary | ICD-10-CM | POA: Diagnosis not present

## 2017-08-23 DIAGNOSIS — N3946 Mixed incontinence: Secondary | ICD-10-CM | POA: Diagnosis not present

## 2017-08-23 DIAGNOSIS — N3000 Acute cystitis without hematuria: Secondary | ICD-10-CM | POA: Diagnosis not present

## 2017-09-06 DIAGNOSIS — I872 Venous insufficiency (chronic) (peripheral): Secondary | ICD-10-CM | POA: Diagnosis not present

## 2017-09-06 DIAGNOSIS — B373 Candidiasis of vulva and vagina: Secondary | ICD-10-CM | POA: Diagnosis not present

## 2017-09-06 DIAGNOSIS — N3000 Acute cystitis without hematuria: Secondary | ICD-10-CM | POA: Diagnosis not present

## 2017-09-07 DIAGNOSIS — I5032 Chronic diastolic (congestive) heart failure: Secondary | ICD-10-CM | POA: Diagnosis not present

## 2017-09-13 DIAGNOSIS — N3000 Acute cystitis without hematuria: Secondary | ICD-10-CM | POA: Diagnosis not present

## 2017-09-13 DIAGNOSIS — I872 Venous insufficiency (chronic) (peripheral): Secondary | ICD-10-CM | POA: Diagnosis not present

## 2017-09-19 DIAGNOSIS — N952 Postmenopausal atrophic vaginitis: Secondary | ICD-10-CM | POA: Diagnosis not present

## 2017-09-19 DIAGNOSIS — N390 Urinary tract infection, site not specified: Secondary | ICD-10-CM | POA: Diagnosis not present

## 2017-09-19 DIAGNOSIS — N3281 Overactive bladder: Secondary | ICD-10-CM | POA: Diagnosis not present

## 2017-09-20 DIAGNOSIS — N3000 Acute cystitis without hematuria: Secondary | ICD-10-CM | POA: Diagnosis not present

## 2017-09-20 DIAGNOSIS — I872 Venous insufficiency (chronic) (peripheral): Secondary | ICD-10-CM | POA: Diagnosis not present

## 2017-10-08 DIAGNOSIS — I5032 Chronic diastolic (congestive) heart failure: Secondary | ICD-10-CM | POA: Diagnosis not present

## 2017-10-10 DIAGNOSIS — N390 Urinary tract infection, site not specified: Secondary | ICD-10-CM | POA: Diagnosis not present

## 2017-10-10 DIAGNOSIS — N3281 Overactive bladder: Secondary | ICD-10-CM | POA: Diagnosis not present

## 2017-11-07 DIAGNOSIS — I5032 Chronic diastolic (congestive) heart failure: Secondary | ICD-10-CM | POA: Diagnosis not present

## 2017-11-09 DIAGNOSIS — N3281 Overactive bladder: Secondary | ICD-10-CM | POA: Diagnosis not present

## 2017-11-09 DIAGNOSIS — N3090 Cystitis, unspecified without hematuria: Secondary | ICD-10-CM | POA: Diagnosis not present

## 2017-11-21 DIAGNOSIS — M10031 Idiopathic gout, right wrist: Secondary | ICD-10-CM | POA: Diagnosis not present

## 2017-12-08 DIAGNOSIS — I5032 Chronic diastolic (congestive) heart failure: Secondary | ICD-10-CM | POA: Diagnosis not present

## 2018-03-24 DIAGNOSIS — I481 Persistent atrial fibrillation: Secondary | ICD-10-CM | POA: Diagnosis not present

## 2018-03-24 DIAGNOSIS — N183 Chronic kidney disease, stage 3 (moderate): Secondary | ICD-10-CM | POA: Diagnosis not present

## 2018-03-24 DIAGNOSIS — E782 Mixed hyperlipidemia: Secondary | ICD-10-CM | POA: Diagnosis not present

## 2018-03-24 DIAGNOSIS — N3000 Acute cystitis without hematuria: Secondary | ICD-10-CM | POA: Diagnosis not present

## 2018-03-24 DIAGNOSIS — I5032 Chronic diastolic (congestive) heart failure: Secondary | ICD-10-CM | POA: Diagnosis not present

## 2018-03-24 DIAGNOSIS — E1149 Type 2 diabetes mellitus with other diabetic neurological complication: Secondary | ICD-10-CM | POA: Diagnosis not present

## 2018-03-24 DIAGNOSIS — E1122 Type 2 diabetes mellitus with diabetic chronic kidney disease: Secondary | ICD-10-CM | POA: Diagnosis not present

## 2018-03-24 DIAGNOSIS — I10 Essential (primary) hypertension: Secondary | ICD-10-CM | POA: Diagnosis not present

## 2018-05-11 DIAGNOSIS — Z23 Encounter for immunization: Secondary | ICD-10-CM | POA: Diagnosis not present

## 2018-05-11 DIAGNOSIS — N3000 Acute cystitis without hematuria: Secondary | ICD-10-CM | POA: Diagnosis not present

## 2018-05-11 DIAGNOSIS — L300 Nummular dermatitis: Secondary | ICD-10-CM | POA: Diagnosis not present

## 2018-08-07 DIAGNOSIS — N3941 Urge incontinence: Secondary | ICD-10-CM | POA: Diagnosis not present

## 2018-08-07 DIAGNOSIS — E1122 Type 2 diabetes mellitus with diabetic chronic kidney disease: Secondary | ICD-10-CM | POA: Diagnosis not present

## 2018-08-07 DIAGNOSIS — I10 Essential (primary) hypertension: Secondary | ICD-10-CM | POA: Diagnosis not present

## 2018-08-07 DIAGNOSIS — E782 Mixed hyperlipidemia: Secondary | ICD-10-CM | POA: Diagnosis not present

## 2018-08-07 DIAGNOSIS — I4811 Longstanding persistent atrial fibrillation: Secondary | ICD-10-CM | POA: Diagnosis not present

## 2018-08-07 DIAGNOSIS — D6489 Other specified anemias: Secondary | ICD-10-CM | POA: Diagnosis not present

## 2018-08-07 DIAGNOSIS — I5032 Chronic diastolic (congestive) heart failure: Secondary | ICD-10-CM | POA: Diagnosis not present

## 2018-08-07 DIAGNOSIS — Z7901 Long term (current) use of anticoagulants: Secondary | ICD-10-CM | POA: Diagnosis not present

## 2018-08-07 DIAGNOSIS — Z6830 Body mass index (BMI) 30.0-30.9, adult: Secondary | ICD-10-CM | POA: Diagnosis not present

## 2018-08-07 DIAGNOSIS — E1142 Type 2 diabetes mellitus with diabetic polyneuropathy: Secondary | ICD-10-CM | POA: Diagnosis not present

## 2018-08-07 DIAGNOSIS — G4733 Obstructive sleep apnea (adult) (pediatric): Secondary | ICD-10-CM | POA: Diagnosis not present

## 2018-08-07 DIAGNOSIS — N183 Chronic kidney disease, stage 3 (moderate): Secondary | ICD-10-CM | POA: Diagnosis not present

## 2018-08-22 DIAGNOSIS — E119 Type 2 diabetes mellitus without complications: Secondary | ICD-10-CM | POA: Diagnosis not present

## 2018-08-22 DIAGNOSIS — H2513 Age-related nuclear cataract, bilateral: Secondary | ICD-10-CM | POA: Diagnosis not present

## 2018-08-31 ENCOUNTER — Encounter: Payer: Self-pay | Admitting: Internal Medicine

## 2018-10-05 DIAGNOSIS — Z8619 Personal history of other infectious and parasitic diseases: Secondary | ICD-10-CM | POA: Diagnosis not present

## 2018-10-05 DIAGNOSIS — Z23 Encounter for immunization: Secondary | ICD-10-CM | POA: Diagnosis not present

## 2018-10-05 DIAGNOSIS — Z09 Encounter for follow-up examination after completed treatment for conditions other than malignant neoplasm: Secondary | ICD-10-CM | POA: Diagnosis not present

## 2018-10-05 DIAGNOSIS — J189 Pneumonia, unspecified organism: Secondary | ICD-10-CM | POA: Diagnosis not present

## 2018-10-05 DIAGNOSIS — Z0000 Encounter for general adult medical examination without abnormal findings: Secondary | ICD-10-CM | POA: Diagnosis not present

## 2018-10-05 DIAGNOSIS — Z7689 Persons encountering health services in other specified circumstances: Secondary | ICD-10-CM | POA: Diagnosis not present

## 2018-11-08 DIAGNOSIS — N390 Urinary tract infection, site not specified: Secondary | ICD-10-CM | POA: Diagnosis not present

## 2018-11-08 DIAGNOSIS — E1142 Type 2 diabetes mellitus with diabetic polyneuropathy: Secondary | ICD-10-CM | POA: Diagnosis not present

## 2018-11-08 DIAGNOSIS — Z1159 Encounter for screening for other viral diseases: Secondary | ICD-10-CM | POA: Diagnosis not present

## 2018-11-08 DIAGNOSIS — Z6830 Body mass index (BMI) 30.0-30.9, adult: Secondary | ICD-10-CM | POA: Diagnosis not present

## 2018-11-08 DIAGNOSIS — M10072 Idiopathic gout, left ankle and foot: Secondary | ICD-10-CM | POA: Diagnosis not present

## 2019-01-23 DIAGNOSIS — Z1159 Encounter for screening for other viral diseases: Secondary | ICD-10-CM | POA: Diagnosis not present

## 2019-01-23 DIAGNOSIS — M10071 Idiopathic gout, right ankle and foot: Secondary | ICD-10-CM | POA: Diagnosis not present

## 2019-05-12 DIAGNOSIS — S62524A Nondisplaced fracture of distal phalanx of right thumb, initial encounter for closed fracture: Secondary | ICD-10-CM | POA: Diagnosis not present

## 2019-05-12 DIAGNOSIS — N179 Acute kidney failure, unspecified: Secondary | ICD-10-CM | POA: Diagnosis not present

## 2019-05-12 DIAGNOSIS — I160 Hypertensive urgency: Secondary | ICD-10-CM | POA: Diagnosis not present

## 2019-05-12 DIAGNOSIS — Z7982 Long term (current) use of aspirin: Secondary | ICD-10-CM | POA: Diagnosis not present

## 2019-05-12 DIAGNOSIS — B9620 Unspecified Escherichia coli [E. coli] as the cause of diseases classified elsewhere: Secondary | ICD-10-CM | POA: Diagnosis not present

## 2019-05-12 DIAGNOSIS — R52 Pain, unspecified: Secondary | ICD-10-CM | POA: Diagnosis not present

## 2019-05-12 DIAGNOSIS — I130 Hypertensive heart and chronic kidney disease with heart failure and stage 1 through stage 4 chronic kidney disease, or unspecified chronic kidney disease: Secondary | ICD-10-CM | POA: Diagnosis not present

## 2019-05-12 DIAGNOSIS — E1165 Type 2 diabetes mellitus with hyperglycemia: Secondary | ICD-10-CM | POA: Diagnosis not present

## 2019-05-12 DIAGNOSIS — E1122 Type 2 diabetes mellitus with diabetic chronic kidney disease: Secondary | ICD-10-CM | POA: Diagnosis not present

## 2019-05-12 DIAGNOSIS — Z9181 History of falling: Secondary | ICD-10-CM | POA: Diagnosis not present

## 2019-05-12 DIAGNOSIS — N183 Chronic kidney disease, stage 3 unspecified: Secondary | ICD-10-CM | POA: Diagnosis not present

## 2019-05-12 DIAGNOSIS — N3000 Acute cystitis without hematuria: Secondary | ICD-10-CM | POA: Diagnosis not present

## 2019-05-12 DIAGNOSIS — Z993 Dependence on wheelchair: Secondary | ICD-10-CM | POA: Diagnosis not present

## 2019-05-12 DIAGNOSIS — Z9114 Patient's other noncompliance with medication regimen: Secondary | ICD-10-CM | POA: Diagnosis not present

## 2019-05-12 DIAGNOSIS — B952 Enterococcus as the cause of diseases classified elsewhere: Secondary | ICD-10-CM | POA: Diagnosis not present

## 2019-05-12 DIAGNOSIS — F0390 Unspecified dementia without behavioral disturbance: Secondary | ICD-10-CM | POA: Diagnosis not present

## 2019-05-12 DIAGNOSIS — Z7901 Long term (current) use of anticoagulants: Secondary | ICD-10-CM | POA: Diagnosis not present

## 2019-05-12 DIAGNOSIS — J449 Chronic obstructive pulmonary disease, unspecified: Secondary | ICD-10-CM | POA: Diagnosis not present

## 2019-05-12 DIAGNOSIS — I5032 Chronic diastolic (congestive) heart failure: Secondary | ICD-10-CM | POA: Diagnosis not present

## 2019-05-12 DIAGNOSIS — I4891 Unspecified atrial fibrillation: Secondary | ICD-10-CM | POA: Diagnosis not present

## 2019-05-12 DIAGNOSIS — I10 Essential (primary) hypertension: Secondary | ICD-10-CM | POA: Diagnosis not present

## 2019-05-12 DIAGNOSIS — R5381 Other malaise: Secondary | ICD-10-CM | POA: Diagnosis not present

## 2019-05-12 DIAGNOSIS — M10331 Gout due to renal impairment, right wrist: Secondary | ICD-10-CM | POA: Diagnosis not present

## 2019-05-12 DIAGNOSIS — Z8673 Personal history of transient ischemic attack (TIA), and cerebral infarction without residual deficits: Secondary | ICD-10-CM | POA: Diagnosis not present

## 2019-05-12 DIAGNOSIS — J984 Other disorders of lung: Secondary | ICD-10-CM | POA: Diagnosis not present

## 2019-05-12 DIAGNOSIS — R262 Difficulty in walking, not elsewhere classified: Secondary | ICD-10-CM | POA: Diagnosis not present

## 2019-05-12 DIAGNOSIS — M79631 Pain in right forearm: Secondary | ICD-10-CM | POA: Diagnosis not present

## 2019-05-12 DIAGNOSIS — Z794 Long term (current) use of insulin: Secondary | ICD-10-CM | POA: Diagnosis not present

## 2019-05-12 DIAGNOSIS — J189 Pneumonia, unspecified organism: Secondary | ICD-10-CM | POA: Diagnosis not present

## 2019-05-12 DIAGNOSIS — D638 Anemia in other chronic diseases classified elsewhere: Secondary | ICD-10-CM | POA: Diagnosis not present

## 2019-05-12 DIAGNOSIS — R609 Edema, unspecified: Secondary | ICD-10-CM | POA: Diagnosis not present

## 2019-05-12 DIAGNOSIS — E039 Hypothyroidism, unspecified: Secondary | ICD-10-CM | POA: Diagnosis not present

## 2019-05-12 DIAGNOSIS — N289 Disorder of kidney and ureter, unspecified: Secondary | ICD-10-CM | POA: Diagnosis not present

## 2019-05-12 DIAGNOSIS — M19041 Primary osteoarthritis, right hand: Secondary | ICD-10-CM | POA: Diagnosis not present

## 2019-05-12 DIAGNOSIS — M79603 Pain in arm, unspecified: Secondary | ICD-10-CM | POA: Diagnosis not present

## 2019-05-17 DIAGNOSIS — I130 Hypertensive heart and chronic kidney disease with heart failure and stage 1 through stage 4 chronic kidney disease, or unspecified chronic kidney disease: Secondary | ICD-10-CM | POA: Diagnosis not present

## 2019-05-17 DIAGNOSIS — Z79899 Other long term (current) drug therapy: Secondary | ICD-10-CM | POA: Diagnosis not present

## 2019-05-17 DIAGNOSIS — J441 Chronic obstructive pulmonary disease with (acute) exacerbation: Secondary | ICD-10-CM | POA: Diagnosis not present

## 2019-05-17 DIAGNOSIS — R5381 Other malaise: Secondary | ICD-10-CM | POA: Diagnosis not present

## 2019-05-17 DIAGNOSIS — B9620 Unspecified Escherichia coli [E. coli] as the cause of diseases classified elsewhere: Secondary | ICD-10-CM | POA: Diagnosis not present

## 2019-05-17 DIAGNOSIS — N183 Chronic kidney disease, stage 3 unspecified: Secondary | ICD-10-CM | POA: Diagnosis not present

## 2019-05-17 DIAGNOSIS — B952 Enterococcus as the cause of diseases classified elsewhere: Secondary | ICD-10-CM | POA: Diagnosis not present

## 2019-05-17 DIAGNOSIS — Z7982 Long term (current) use of aspirin: Secondary | ICD-10-CM | POA: Diagnosis not present

## 2019-05-17 DIAGNOSIS — F0390 Unspecified dementia without behavioral disturbance: Secondary | ICD-10-CM | POA: Diagnosis not present

## 2019-05-17 DIAGNOSIS — E1122 Type 2 diabetes mellitus with diabetic chronic kidney disease: Secondary | ICD-10-CM | POA: Diagnosis not present

## 2019-05-17 DIAGNOSIS — Z794 Long term (current) use of insulin: Secondary | ICD-10-CM | POA: Diagnosis not present

## 2019-05-17 DIAGNOSIS — S62524D Nondisplaced fracture of distal phalanx of right thumb, subsequent encounter for fracture with routine healing: Secondary | ICD-10-CM | POA: Diagnosis not present

## 2019-05-17 DIAGNOSIS — R262 Difficulty in walking, not elsewhere classified: Secondary | ICD-10-CM | POA: Diagnosis not present

## 2019-05-17 DIAGNOSIS — I480 Paroxysmal atrial fibrillation: Secondary | ICD-10-CM | POA: Diagnosis not present

## 2019-05-17 DIAGNOSIS — I2510 Atherosclerotic heart disease of native coronary artery without angina pectoris: Secondary | ICD-10-CM | POA: Diagnosis not present

## 2019-05-17 DIAGNOSIS — Z7902 Long term (current) use of antithrombotics/antiplatelets: Secondary | ICD-10-CM | POA: Diagnosis not present

## 2019-05-17 DIAGNOSIS — R2681 Unsteadiness on feet: Secondary | ICD-10-CM | POA: Diagnosis not present

## 2019-05-17 DIAGNOSIS — Z792 Long term (current) use of antibiotics: Secondary | ICD-10-CM | POA: Diagnosis not present

## 2019-05-17 DIAGNOSIS — E039 Hypothyroidism, unspecified: Secondary | ICD-10-CM | POA: Diagnosis not present

## 2019-05-17 DIAGNOSIS — F418 Other specified anxiety disorders: Secondary | ICD-10-CM | POA: Diagnosis not present

## 2019-05-17 DIAGNOSIS — D631 Anemia in chronic kidney disease: Secondary | ICD-10-CM | POA: Diagnosis not present

## 2019-05-17 DIAGNOSIS — I5032 Chronic diastolic (congestive) heart failure: Secondary | ICD-10-CM | POA: Diagnosis not present

## 2019-05-17 DIAGNOSIS — M1A9XX0 Chronic gout, unspecified, without tophus (tophi): Secondary | ICD-10-CM | POA: Diagnosis not present

## 2019-05-17 DIAGNOSIS — N3000 Acute cystitis without hematuria: Secondary | ICD-10-CM | POA: Diagnosis not present

## 2019-05-17 DIAGNOSIS — I160 Hypertensive urgency: Secondary | ICD-10-CM | POA: Diagnosis not present

## 2019-05-29 DIAGNOSIS — N390 Urinary tract infection, site not specified: Secondary | ICD-10-CM | POA: Diagnosis not present

## 2019-05-29 DIAGNOSIS — S62523A Displaced fracture of distal phalanx of unspecified thumb, initial encounter for closed fracture: Secondary | ICD-10-CM | POA: Diagnosis not present

## 2019-05-29 DIAGNOSIS — R609 Edema, unspecified: Secondary | ICD-10-CM | POA: Diagnosis not present

## 2019-06-16 DIAGNOSIS — E039 Hypothyroidism, unspecified: Secondary | ICD-10-CM | POA: Diagnosis not present

## 2019-06-16 DIAGNOSIS — I5032 Chronic diastolic (congestive) heart failure: Secondary | ICD-10-CM | POA: Diagnosis not present

## 2019-06-16 DIAGNOSIS — I160 Hypertensive urgency: Secondary | ICD-10-CM | POA: Diagnosis not present

## 2019-06-16 DIAGNOSIS — Z9181 History of falling: Secondary | ICD-10-CM | POA: Diagnosis not present

## 2019-06-16 DIAGNOSIS — N3000 Acute cystitis without hematuria: Secondary | ICD-10-CM | POA: Diagnosis not present

## 2019-06-16 DIAGNOSIS — W19XXXD Unspecified fall, subsequent encounter: Secondary | ICD-10-CM | POA: Diagnosis not present

## 2019-06-16 DIAGNOSIS — I480 Paroxysmal atrial fibrillation: Secondary | ICD-10-CM | POA: Diagnosis not present

## 2019-06-16 DIAGNOSIS — E1122 Type 2 diabetes mellitus with diabetic chronic kidney disease: Secondary | ICD-10-CM | POA: Diagnosis not present

## 2019-06-16 DIAGNOSIS — R5381 Other malaise: Secondary | ICD-10-CM | POA: Diagnosis not present

## 2019-06-16 DIAGNOSIS — Z87440 Personal history of urinary (tract) infections: Secondary | ICD-10-CM | POA: Diagnosis not present

## 2019-06-16 DIAGNOSIS — D631 Anemia in chronic kidney disease: Secondary | ICD-10-CM | POA: Diagnosis not present

## 2019-06-16 DIAGNOSIS — R262 Difficulty in walking, not elsewhere classified: Secondary | ICD-10-CM | POA: Diagnosis not present

## 2019-06-16 DIAGNOSIS — Z7902 Long term (current) use of antithrombotics/antiplatelets: Secondary | ICD-10-CM | POA: Diagnosis not present

## 2019-06-16 DIAGNOSIS — I2510 Atherosclerotic heart disease of native coronary artery without angina pectoris: Secondary | ICD-10-CM | POA: Diagnosis not present

## 2019-06-16 DIAGNOSIS — F0390 Unspecified dementia without behavioral disturbance: Secondary | ICD-10-CM | POA: Diagnosis not present

## 2019-06-16 DIAGNOSIS — F418 Other specified anxiety disorders: Secondary | ICD-10-CM | POA: Diagnosis not present

## 2019-06-16 DIAGNOSIS — M1A9XX0 Chronic gout, unspecified, without tophus (tophi): Secondary | ICD-10-CM | POA: Diagnosis not present

## 2019-06-16 DIAGNOSIS — I130 Hypertensive heart and chronic kidney disease with heart failure and stage 1 through stage 4 chronic kidney disease, or unspecified chronic kidney disease: Secondary | ICD-10-CM | POA: Diagnosis not present

## 2019-06-16 DIAGNOSIS — Z993 Dependence on wheelchair: Secondary | ICD-10-CM | POA: Diagnosis not present

## 2019-06-16 DIAGNOSIS — R2681 Unsteadiness on feet: Secondary | ICD-10-CM | POA: Diagnosis not present

## 2019-06-16 DIAGNOSIS — Z794 Long term (current) use of insulin: Secondary | ICD-10-CM | POA: Diagnosis not present

## 2019-06-16 DIAGNOSIS — J441 Chronic obstructive pulmonary disease with (acute) exacerbation: Secondary | ICD-10-CM | POA: Diagnosis not present

## 2019-06-16 DIAGNOSIS — N183 Chronic kidney disease, stage 3 unspecified: Secondary | ICD-10-CM | POA: Diagnosis not present

## 2019-06-16 DIAGNOSIS — Z79899 Other long term (current) drug therapy: Secondary | ICD-10-CM | POA: Diagnosis not present

## 2019-06-16 DIAGNOSIS — B9620 Unspecified Escherichia coli [E. coli] as the cause of diseases classified elsewhere: Secondary | ICD-10-CM | POA: Diagnosis not present

## 2019-06-16 DIAGNOSIS — B952 Enterococcus as the cause of diseases classified elsewhere: Secondary | ICD-10-CM | POA: Diagnosis not present

## 2019-06-16 DIAGNOSIS — Z7982 Long term (current) use of aspirin: Secondary | ICD-10-CM | POA: Diagnosis not present

## 2019-06-16 DIAGNOSIS — S62524D Nondisplaced fracture of distal phalanx of right thumb, subsequent encounter for fracture with routine healing: Secondary | ICD-10-CM | POA: Diagnosis not present

## 2019-07-02 ENCOUNTER — Other Ambulatory Visit: Payer: Self-pay

## 2019-07-02 ENCOUNTER — Inpatient Hospital Stay (HOSPITAL_COMMUNITY)
Admission: EM | Admit: 2019-07-02 | Discharge: 2019-07-03 | DRG: 291 | Disposition: A | Discharge status: Home Health | Payer: PPO | Attending: Family Medicine | Admitting: Family Medicine

## 2019-07-02 ENCOUNTER — Encounter (HOSPITAL_COMMUNITY): Payer: Self-pay | Admitting: Emergency Medicine

## 2019-07-02 ENCOUNTER — Emergency Department (HOSPITAL_COMMUNITY): Payer: PPO

## 2019-07-02 ENCOUNTER — Observation Stay (HOSPITAL_BASED_OUTPATIENT_CLINIC_OR_DEPARTMENT_OTHER): Payer: PPO

## 2019-07-02 DIAGNOSIS — Z20828 Contact with and (suspected) exposure to other viral communicable diseases: Secondary | ICD-10-CM | POA: Diagnosis present

## 2019-07-02 DIAGNOSIS — I4891 Unspecified atrial fibrillation: Secondary | ICD-10-CM

## 2019-07-02 DIAGNOSIS — I083 Combined rheumatic disorders of mitral, aortic and tricuspid valves: Secondary | ICD-10-CM | POA: Diagnosis not present

## 2019-07-02 DIAGNOSIS — E1121 Type 2 diabetes mellitus with diabetic nephropathy: Secondary | ICD-10-CM | POA: Diagnosis not present

## 2019-07-02 DIAGNOSIS — E785 Hyperlipidemia, unspecified: Secondary | ICD-10-CM | POA: Diagnosis present

## 2019-07-02 DIAGNOSIS — E039 Hypothyroidism, unspecified: Secondary | ICD-10-CM | POA: Diagnosis not present

## 2019-07-02 DIAGNOSIS — Z90711 Acquired absence of uterus with remaining cervical stump: Secondary | ICD-10-CM | POA: Diagnosis not present

## 2019-07-02 DIAGNOSIS — M109 Gout, unspecified: Secondary | ICD-10-CM | POA: Diagnosis not present

## 2019-07-02 DIAGNOSIS — E119 Type 2 diabetes mellitus without complications: Secondary | ICD-10-CM

## 2019-07-02 DIAGNOSIS — I160 Hypertensive urgency: Secondary | ICD-10-CM | POA: Diagnosis not present

## 2019-07-02 DIAGNOSIS — D631 Anemia in chronic kidney disease: Secondary | ICD-10-CM | POA: Diagnosis not present

## 2019-07-02 DIAGNOSIS — Z87891 Personal history of nicotine dependence: Secondary | ICD-10-CM | POA: Diagnosis not present

## 2019-07-02 DIAGNOSIS — N184 Chronic kidney disease, stage 4 (severe): Secondary | ICD-10-CM | POA: Diagnosis present

## 2019-07-02 DIAGNOSIS — I10 Essential (primary) hypertension: Secondary | ICD-10-CM | POA: Diagnosis present

## 2019-07-02 DIAGNOSIS — Z9114 Patient's other noncompliance with medication regimen: Secondary | ICD-10-CM | POA: Diagnosis not present

## 2019-07-02 DIAGNOSIS — I499 Cardiac arrhythmia, unspecified: Secondary | ICD-10-CM | POA: Diagnosis not present

## 2019-07-02 DIAGNOSIS — J189 Pneumonia, unspecified organism: Secondary | ICD-10-CM

## 2019-07-02 DIAGNOSIS — E1122 Type 2 diabetes mellitus with diabetic chronic kidney disease: Secondary | ICD-10-CM | POA: Diagnosis not present

## 2019-07-02 DIAGNOSIS — I340 Nonrheumatic mitral (valve) insufficiency: Secondary | ICD-10-CM | POA: Diagnosis not present

## 2019-07-02 DIAGNOSIS — Z9049 Acquired absence of other specified parts of digestive tract: Secondary | ICD-10-CM | POA: Diagnosis not present

## 2019-07-02 DIAGNOSIS — N183 Chronic kidney disease, stage 3 unspecified: Secondary | ICD-10-CM | POA: Diagnosis present

## 2019-07-02 DIAGNOSIS — I5033 Acute on chronic diastolic (congestive) heart failure: Secondary | ICD-10-CM | POA: Diagnosis not present

## 2019-07-02 DIAGNOSIS — I130 Hypertensive heart and chronic kidney disease with heart failure and stage 1 through stage 4 chronic kidney disease, or unspecified chronic kidney disease: Principal | ICD-10-CM | POA: Diagnosis present

## 2019-07-02 DIAGNOSIS — Z794 Long term (current) use of insulin: Secondary | ICD-10-CM

## 2019-07-02 DIAGNOSIS — Z888 Allergy status to other drugs, medicaments and biological substances status: Secondary | ICD-10-CM

## 2019-07-02 DIAGNOSIS — E872 Acidosis: Secondary | ICD-10-CM | POA: Diagnosis present

## 2019-07-02 DIAGNOSIS — I482 Chronic atrial fibrillation, unspecified: Secondary | ICD-10-CM | POA: Diagnosis not present

## 2019-07-02 DIAGNOSIS — Z7982 Long term (current) use of aspirin: Secondary | ICD-10-CM | POA: Diagnosis not present

## 2019-07-02 DIAGNOSIS — E1165 Type 2 diabetes mellitus with hyperglycemia: Secondary | ICD-10-CM | POA: Diagnosis not present

## 2019-07-02 DIAGNOSIS — N1832 Chronic kidney disease, stage 3b: Secondary | ICD-10-CM | POA: Diagnosis not present

## 2019-07-02 DIAGNOSIS — Z79899 Other long term (current) drug therapy: Secondary | ICD-10-CM | POA: Diagnosis not present

## 2019-07-02 DIAGNOSIS — R002 Palpitations: Secondary | ICD-10-CM | POA: Diagnosis not present

## 2019-07-02 DIAGNOSIS — R000 Tachycardia, unspecified: Secondary | ICD-10-CM | POA: Diagnosis not present

## 2019-07-02 DIAGNOSIS — R079 Chest pain, unspecified: Secondary | ICD-10-CM | POA: Diagnosis not present

## 2019-07-02 HISTORY — DX: Chronic kidney disease, stage 3 unspecified: N18.30

## 2019-07-02 LAB — COMPREHENSIVE METABOLIC PANEL
ALT: 12 U/L (ref 0–44)
AST: 12 U/L — ABNORMAL LOW (ref 15–41)
Albumin: 2.9 g/dL — ABNORMAL LOW (ref 3.5–5.0)
Alkaline Phosphatase: 57 U/L (ref 38–126)
Anion gap: 10 (ref 5–15)
BUN: 33 mg/dL — ABNORMAL HIGH (ref 8–23)
CO2: 20 mmol/L — ABNORMAL LOW (ref 22–32)
Calcium: 8.8 mg/dL — ABNORMAL LOW (ref 8.9–10.3)
Chloride: 107 mmol/L (ref 98–111)
Creatinine, Ser: 2.63 mg/dL — ABNORMAL HIGH (ref 0.44–1.00)
GFR calc Af Amer: 19 mL/min — ABNORMAL LOW (ref >60)
GFR calc non Af Amer: 17 mL/min — ABNORMAL LOW (ref >60)
Glucose, Bld: 308 mg/dL — ABNORMAL HIGH (ref 70–99)
Potassium: 3.4 mmol/L — ABNORMAL LOW (ref 3.5–5.1)
Sodium: 137 mmol/L (ref 135–145)
Total Bilirubin: 0.6 mg/dL (ref 0.3–1.2)
Total Protein: 6.4 g/dL — ABNORMAL LOW (ref 6.5–8.1)

## 2019-07-02 LAB — CBG MONITORING, ED
Glucose-Capillary: 229 mg/dL — ABNORMAL HIGH (ref 70–99)
Glucose-Capillary: 193 mg/dL — ABNORMAL HIGH (ref 70–99)
Glucose-Capillary: 226 mg/dL — ABNORMAL HIGH (ref 70–99)

## 2019-07-02 LAB — URINALYSIS, ROUTINE W REFLEX MICROSCOPIC
Bilirubin Urine: NEGATIVE
Glucose, UA: >=500 mg/dL — AB
Hgb urine dipstick: NEGATIVE
Ketones, ur: NEGATIVE mg/dL
Leukocytes,Ua: NEGATIVE
Nitrite: NEGATIVE
Protein, ur: >=300 mg/dL — AB
Specific Gravity, Urine: 1.015 (ref 1.005–1.030)
pH: 5 (ref 5.0–8.0)

## 2019-07-02 LAB — BASIC METABOLIC PANEL
Anion gap: 13 (ref 5–15)
BUN: 35 mg/dL — ABNORMAL HIGH (ref 8–23)
CO2: 18 mmol/L — ABNORMAL LOW (ref 22–32)
Calcium: 9 mg/dL (ref 8.9–10.3)
Chloride: 105 mmol/L (ref 98–111)
Creatinine, Ser: 2.64 mg/dL — ABNORMAL HIGH (ref 0.44–1.00)
GFR calc Af Amer: 19 mL/min — ABNORMAL LOW (ref >60)
GFR calc non Af Amer: 17 mL/min — ABNORMAL LOW (ref >60)
Glucose, Bld: 317 mg/dL — ABNORMAL HIGH (ref 70–99)
Potassium: 3.7 mmol/L (ref 3.5–5.1)
Sodium: 136 mmol/L (ref 135–145)

## 2019-07-02 LAB — LACTIC ACID, PLASMA: Lactic Acid, Venous: 0.9 mmol/L (ref 0.5–1.9)

## 2019-07-02 LAB — ECHOCARDIOGRAM COMPLETE
Height: 66 in
Weight: 2848 oz

## 2019-07-02 LAB — CBC
HCT: 33.9 % — ABNORMAL LOW (ref 36.0–46.0)
HCT: 32.7 % — ABNORMAL LOW (ref 36.0–46.0)
Hemoglobin: 11 g/dL — ABNORMAL LOW (ref 12.0–15.0)
Hemoglobin: 10.4 g/dL — ABNORMAL LOW (ref 12.0–15.0)
MCH: 28.1 pg (ref 26.0–34.0)
MCH: 27.6 pg (ref 26.0–34.0)
MCHC: 32.4 g/dL (ref 30.0–36.0)
MCHC: 31.8 g/dL (ref 30.0–36.0)
MCV: 86.5 fL (ref 80.0–100.0)
MCV: 86.7 fL (ref 80.0–100.0)
Platelets: 182 10*3/uL (ref 150–400)
Platelets: 162 10*3/uL (ref 150–400)
RBC: 3.92 MIL/uL (ref 3.87–5.11)
RBC: 3.77 MIL/uL — ABNORMAL LOW (ref 3.87–5.11)
RDW: 14.7 % (ref 11.5–15.5)
RDW: 14.6 % (ref 11.5–15.5)
WBC: 10.9 10*3/uL — ABNORMAL HIGH (ref 4.0–10.5)
WBC: 8.8 10*3/uL (ref 4.0–10.5)
nRBC: 0 % (ref 0.0–0.2)
nRBC: 0 % (ref 0.0–0.2)

## 2019-07-02 LAB — TROPONIN I (HIGH SENSITIVITY)
Troponin I (High Sensitivity): 12 ng/L (ref <18)
Troponin I (High Sensitivity): 15 ng/L (ref <18)

## 2019-07-02 LAB — STREP PNEUMONIAE URINARY ANTIGEN: Strep Pneumo Urinary Antigen: NEGATIVE

## 2019-07-02 LAB — SARS CORONAVIRUS 2 (TAT 6-24 HRS): SARS Coronavirus 2: NEGATIVE

## 2019-07-02 LAB — BRAIN NATRIURETIC PEPTIDE: B Natriuretic Peptide: 337.3 pg/mL — ABNORMAL HIGH (ref 0.0–100.0)

## 2019-07-02 LAB — TSH: TSH: 2.38 u[IU]/mL (ref 0.350–4.500)

## 2019-07-02 LAB — GLUCOSE, CAPILLARY: Glucose-Capillary: 219 mg/dL — ABNORMAL HIGH (ref 70–99)

## 2019-07-02 LAB — MAGNESIUM: Magnesium: 2 mg/dL (ref 1.7–2.4)

## 2019-07-02 LAB — PROCALCITONIN: Procalcitonin: <0.1 ng/mL

## 2019-07-02 MED ORDER — HYDRALAZINE HCL 50 MG PO TABS
50.0000 mg | ORAL_TABLET | Freq: Four times a day (QID) | ORAL | Status: DC
  Administered 2019-07-02 – 2019-07-03 (×5): 50 mg via ORAL
  Filled 2019-07-02: qty 5
  Filled 2019-07-02: qty 1
  Filled 2019-07-02: qty 5
  Filled 2019-07-02 (×2): qty 1

## 2019-07-02 MED ORDER — AMLODIPINE BESYLATE 10 MG PO TABS
10.0000 mg | ORAL_TABLET | Freq: Every day | ORAL | Status: DC
  Administered 2019-07-02 – 2019-07-03 (×2): 10 mg via ORAL
  Filled 2019-07-02: qty 1
  Filled 2019-07-02: qty 2

## 2019-07-02 MED ORDER — MELATONIN 3 MG PO TABS
3.0000 mg | ORAL_TABLET | Freq: Every day | ORAL | Status: DC
  Administered 2019-07-02: 3 mg via ORAL
  Filled 2019-07-02: qty 1

## 2019-07-02 MED ORDER — METOPROLOL TARTRATE 25 MG PO TABS
25.0000 mg | ORAL_TABLET | Freq: Two times a day (BID) | ORAL | Status: DC
  Administered 2019-07-02: 09:00:00 25 mg via ORAL
  Filled 2019-07-02: qty 1

## 2019-07-02 MED ORDER — POTASSIUM CHLORIDE CRYS ER 20 MEQ PO TBCR
40.0000 meq | EXTENDED_RELEASE_TABLET | Freq: Two times a day (BID) | ORAL | Status: DC
  Administered 2019-07-02 – 2019-07-03 (×2): 40 meq via ORAL
  Filled 2019-07-02 (×2): qty 2

## 2019-07-02 MED ORDER — APIXABAN 2.5 MG PO TABS
2.5000 mg | ORAL_TABLET | Freq: Two times a day (BID) | ORAL | Status: DC
  Administered 2019-07-02 – 2019-07-03 (×3): 2.5 mg via ORAL
  Filled 2019-07-02 (×4): qty 1

## 2019-07-02 MED ORDER — ACETAMINOPHEN 650 MG RE SUPP: 650.0000 mg | Freq: Four times a day (QID) | RECTAL | Status: DC | PRN

## 2019-07-02 MED ORDER — ISOSORBIDE MONONITRATE ER 60 MG PO TB24
60.0000 mg | ORAL_TABLET | Freq: Every day | ORAL | Status: DC
  Administered 2019-07-02 – 2019-07-03 (×2): 60 mg via ORAL
  Filled 2019-07-02 (×2): qty 1

## 2019-07-02 MED ORDER — INSULIN ASPART 100 UNIT/ML ~~LOC~~ SOLN
0.0000 [IU] | Freq: Three times a day (TID) | SUBCUTANEOUS | Status: DC
  Administered 2019-07-02 (×2): 3 [IU] via SUBCUTANEOUS
  Administered 2019-07-02: 2 [IU] via SUBCUTANEOUS
  Administered 2019-07-03: 3 [IU] via SUBCUTANEOUS
  Administered 2019-07-03: 5 [IU] via SUBCUTANEOUS
  Filled 2019-07-02: qty 0.09

## 2019-07-02 MED ORDER — SODIUM CHLORIDE 0.9% FLUSH: 3.0000 mL | Freq: Once | INTRAVENOUS | Status: DC

## 2019-07-02 MED ORDER — DILTIAZEM HCL-DEXTROSE 125-5 MG/125ML-% IV SOLN (PREMIX)
5.0000 mg/h | INTRAVENOUS | Status: DC
  Administered 2019-07-02: 04:00:00 5 mg/h via INTRAVENOUS
  Filled 2019-07-02: qty 125

## 2019-07-02 MED ORDER — AZITHROMYCIN 1 G PO PACK
PACK | ORAL | Status: AC
  Filled 2019-07-02: qty 1

## 2019-07-02 MED ORDER — IRBESARTAN 300 MG PO TABS
300.0000 mg | ORAL_TABLET | Freq: Every day | ORAL | Status: DC
  Administered 2019-07-02 – 2019-07-03 (×2): 300 mg via ORAL
  Filled 2019-07-02 (×2): qty 1

## 2019-07-02 MED ORDER — SODIUM CHLORIDE 0.9 % IV SOLN
2.0000 g | Freq: Every day | INTRAVENOUS | Status: DC
  Administered 2019-07-02 – 2019-07-03 (×2): 2 g via INTRAVENOUS
  Filled 2019-07-02 (×2): qty 20

## 2019-07-02 MED ORDER — ENOXAPARIN SODIUM 30 MG/0.3ML ~~LOC~~ SOLN: 30.0000 mg | Freq: Every day | SUBCUTANEOUS | Status: DC

## 2019-07-02 MED ORDER — APIXABAN 5 MG PO TABS
5.0000 mg | ORAL_TABLET | Freq: Two times a day (BID) | ORAL | Status: DC
  Administered 2019-07-02: 5 mg via ORAL
  Filled 2019-07-02: qty 1

## 2019-07-02 MED ORDER — FUROSEMIDE 40 MG PO TABS
40.0000 mg | ORAL_TABLET | Freq: Every day | ORAL | Status: DC
  Administered 2019-07-02: 11:00:00 40 mg via ORAL
  Filled 2019-07-02: qty 1

## 2019-07-02 MED ORDER — LEVOTHYROXINE SODIUM 25 MCG PO TABS
125.0000 ug | ORAL_TABLET | Freq: Every day | ORAL | Status: DC
  Administered 2019-07-02 – 2019-07-03 (×2): 125 ug via ORAL
  Filled 2019-07-02 (×2): qty 1

## 2019-07-02 MED ORDER — ALLOPURINOL 100 MG PO TABS
100.0000 mg | ORAL_TABLET | Freq: Every day | ORAL | Status: DC
  Administered 2019-07-02 – 2019-07-03 (×2): 100 mg via ORAL
  Filled 2019-07-02 (×2): qty 1

## 2019-07-02 MED ORDER — INSULIN ASPART 100 UNIT/ML ~~LOC~~ SOLN
0.0000 [IU] | Freq: Every day | SUBCUTANEOUS | Status: DC
  Administered 2019-07-02: 22:00:00 2 [IU] via SUBCUTANEOUS
  Filled 2019-07-02: qty 0.05

## 2019-07-02 MED ORDER — FUROSEMIDE 10 MG/ML IJ SOLN
40.0000 mg | Freq: Two times a day (BID) | INTRAMUSCULAR | Status: DC
  Administered 2019-07-02 – 2019-07-03 (×2): 40 mg via INTRAVENOUS
  Filled 2019-07-02: qty 4

## 2019-07-02 MED ORDER — ASPIRIN 81 MG PO CHEW
324.0000 mg | CHEWABLE_TABLET | Freq: Once | ORAL | Status: AC
  Administered 2019-07-02: 324 mg via ORAL
  Filled 2019-07-02: qty 4

## 2019-07-02 MED ORDER — ACETAMINOPHEN 325 MG PO TABS
650.0000 mg | ORAL_TABLET | Freq: Four times a day (QID) | ORAL | Status: DC | PRN
  Administered 2019-07-03 (×2): 650 mg via ORAL
  Filled 2019-07-02 (×2): qty 2

## 2019-07-02 MED ORDER — LINAGLIPTIN 5 MG PO TABS
5.0000 mg | ORAL_TABLET | Freq: Every day | ORAL | Status: DC
  Administered 2019-07-02 – 2019-07-03 (×2): 5 mg via ORAL
  Filled 2019-07-02 (×2): qty 1

## 2019-07-02 MED ORDER — SODIUM CHLORIDE 0.9 % IV SOLN
500.0000 mg | Freq: Every day | INTRAVENOUS | Status: DC
  Administered 2019-07-02 – 2019-07-03 (×2): 500 mg via INTRAVENOUS
  Filled 2019-07-02 (×2): qty 500

## 2019-07-02 NOTE — ED Triage Notes (Signed)
Patient arrived by [facility] from home. Patient was in afib with rvr, Patient tried vagal maneuvers and they did not work. Patient got 20 mg of Cardizem from EMS. EMS states she went back into sinus rhythm with hr in the 90's.

## 2019-07-02 NOTE — Progress Notes (Signed)
PROGRESS NOTE    Connie Cooley  SWN:[id number] DOB: Nov 20, 1940 DOA: 07/02/2019 PCP: Lillard Anes, MD      Brief Narrative:  Connie Cooley is a 78 y.o. 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  In the ER, found to have Afib with RVR.  Started on diltiazem infusion, rate slowed.  CXR noted to have opacities, started on empiric antibiotics, COVID pending.       Assessment & Plan:  Afib with RVR Admitted and started on diltiazem.  Rates controlled already, down to 60s, still Afib.  -Continue apixaban -Stop dilt -Start oral metoprolol  Pneumonia COVID negative.   -Continue ceftriaxone and azithromycin -Check procalcitonins and de-escalate if able  Hypertensive urgency Blood pressure still elevated -Continue furosemide, hydralazine -Resume amlodipine, irbesartan, Imdur  Acute on chronic diastolic CHF -Obtain echo -IV Lasix and K -Strict I/Os -Daily weights  Diabetes -Continue SS corrections insulin -Start Lantus 15 -Continue DPP 4  CKD IV AKI ruled out Metabolic acidosis Baseline Cr is 2.4 to 2.5 per outside records  Gout -Continue allopurinol  Hypothyroidism -Continue levothyroxine  Anemia of CKD IV Stable relative to baseline        DVT prophylaxis: N/A on ELiquis Code Status: FULL Family Communication:  MDM and disposition Plan: This is a no charge note.  For further details, please see H&P by my partner Dr. Maudie Mercury from earlier today.  The below labs and imaging reports were reviewed and summarized above.    The patient was admitted with Pneumonia and Afib with RVR.      Objective: Vitals:   07/02/19 1630 07/02/19 1700 07/02/19 1730 07/02/19 1800  BP: (!) 186/62 (!) 185/58 (!) 199/62 (!) 191/85  Pulse: (!) 51 (!) 52 66 (!) 52  Resp: 16 18 20 16   Temp:      TempSrc:      SpO2: 96% 96% 96% 98%  Weight:      Height:        Intake/Output Summary (Last 24 hours) at  07/02/2019 1827 Last data filed at 07/02/2019 0756 Gross per 24 hour  Intake 350 ml  Output --  Net 350 ml   Filed Weights   07/02/19 0230  Weight: 80.7 kg    Examination: The patient was seen and examined.      Data Reviewed: I have personally reviewed following labs and imaging studies:  CBC: Recent Labs  Lab 07/02/19 0224 07/02/19 0447  WBC 10.9* 8.8  HGB 11.0* 10.4*  HCT 33.9* 32.7*  MCV 86.5 86.7  PLT 182 938   Basic Metabolic Panel: Recent Labs  Lab 07/02/19 0224 07/02/19 0447  NA 136 137  K 3.7 3.4*  CL 105 107  CO2 18* 20*  GLUCOSE 317* 308*  BUN 35* 33*  CREATININE 2.64* 2.63*  CALCIUM 9.0 8.8*  MG 2.0  --    GFR: Estimated Creatinine Clearance: 18.9 mL/min (A) (by C-G formula based on SCr of 2.63 mg/dL (H)). Liver Function Tests: Recent Labs  Lab 07/02/19 0447  AST 12*  ALT 12  ALKPHOS 57  BILITOT 0.6  PROT 6.4*  ALBUMIN 2.9*   No results for input(s): LIPASE, AMYLASE in the last 168 hours. No results for input(s): AMMONIA in the last 168 hours. Coagulation Profile: No results for input(s): INR, PROTIME in the last 168 hours. Cardiac Enzymes: No results for input(s): CKTOTAL, CKMB, CKMBINDEX, TROPONINI in the last 168 hours. BNP (last 3 results) No  results for input(s): PROBNP in the last 8760 hours. HbA1C: No results for input(s): HGBA1C in the last 72 hours. CBG: Recent Labs  Lab 07/02/19 0848 07/02/19 1143 07/02/19 1803  GLUCAP 229* 193* 226*   Lipid Profile: No results for input(s): CHOL, HDL, LDLCALC, TRIG, CHOLHDL, LDLDIRECT in the last 72 hours. Thyroid Function Tests: Recent Labs    07/02/19 0222  TSH 2.380   Anemia Panel: No results for input(s): VITAMINB12, FOLATE, FERRITIN, TIBC, IRON, RETICCTPCT in the last 72 hours. Urine analysis:    Component Value Date/Time   COLORURINE YELLOW 07/02/2019 0425   APPEARANCEUR CLEAR 07/02/2019 0425   LABSPEC 1.015 07/02/2019 0425   PHURINE 5.0 07/02/2019 0425    GLUCOSEU >=500 (A) 07/02/2019 0425   HGBUR NEGATIVE 07/02/2019 0425   BILIRUBINUR NEGATIVE 07/02/2019 0425   KETONESUR NEGATIVE 07/02/2019 0425   PROTEINUR >=300 (A) 07/02/2019 0425   NITRITE NEGATIVE 07/02/2019 0425   LEUKOCYTESUR NEGATIVE 07/02/2019 0425   Sepsis Labs: @LABRCNTIP (procalcitonin:4,lacticacidven:4)  ) Recent Results (from the past 240 hour(s))  SARS CORONAVIRUS 2 (TAT 6-24 HRS) Nasopharyngeal Nasopharyngeal Swab     Status: None   Collection Time: 07/02/19  4:47 AM   Specimen: Nasopharyngeal Swab  Result Value Ref Range Status   SARS Coronavirus 2 NEGATIVE NEGATIVE Final    Comment: (NOTE) SARS-CoV-2 target nucleic acids are NOT DETECTED. The SARS-CoV-2 RNA is generally detectable in upper and lower respiratory specimens during the acute phase of infection. Negative results do not preclude SARS-CoV-2 infection, do not rule out co-infections with other pathogens, and should not be used as the sole basis for treatment or other patient management decisions. Negative results must be combined with clinical observations, patient history, and epidemiological information. The expected result is Negative. Fact Sheet for Patients: SugarRoll.be Fact Sheet for Healthcare Providers: https://www.woods-mathews.com/ This test is not yet approved or cleared by the [place] FDA and  has been authorized for detection and/or diagnosis of SARS-CoV-2 by FDA under an Emergency Use Authorization (EUA). This EUA will remain  in effect (meaning this test can be used) for the duration of the COVID-19 declaration under Section 56 4(b)(1) of the Act, 21 U.S.C. section 360bbb-3(b)(1), unless the authorization is terminated or revoked sooner. Performed at [facility], [place] [address], [place], [place] [address]          Radiology Studies: DG Chest 2 View  Result Date: 07/02/2019 CLINICAL DATA:  Chest pain, tachycardia, history of  CHF and hypertension EXAM: CHEST - 2 VIEW COMPARISON:  Radiograph 05/12/2019, CT 09/28/2016 FINDINGS: Hyperinflation of the lungs with flattening of the diaphragms and chronic interstitial changes in the bases are similar to prior. Patchy airspace disease is noted in the right mid lung periphery and infrahilar lung with some air bronchograms. Suspect some streaky retrocardiac opacity as well. No pneumothorax. No effusion. The aorta is calcified. The remaining cardiomediastinal contours are unremarkable. No acute osseous or soft tissue abnormality. Degenerative changes are present in the imaged spine and shoulders. Telemetry leads overlie the chest. IMPRESSION: 1. Patchy airspace disease in the right mid lung periphery and infrahilar lung suspicious for pneumonia. 2. Suspect some streaky retrocardiac opacity as well. 3. Background of hyperinflation with chronic interstitial changes Electronically Signed   By: Lovena Le M.D.   On: 07/02/2019 02:53   ECHOCARDIOGRAM COMPLETE  Result Date: 07/02/2019   ECHOCARDIOGRAM REPORT   Patient Name:   DARIANNY MOMON Date of Exam: 07/02/2019 Medical Rec #:  [id number]  Height:       66.0 in Accession #:    [id number]      Weight:       178.0 lb Date of Birth:  12-27-1940      BSA:          1.90 m Patient Age:    25 years        BP:           176/64 mmHg Patient Gender: F               HR:           51 bpm. Exam Location:  Inpatient Procedure: 2D Echo, Cardiac Doppler and Color Doppler Indications:    Atrial fibrillation  History:        Patient has no prior history of Echocardiogram examinations.                 CHF, Arrythmias:Atrial Fibrillation; Risk Factors:Hypertension,                 Diabetes and Dyslipidemia. CKD.  Sonographer:    Dustin Flock Referring Phys: [place]  1. Left ventricular ejection fraction, by visual estimation, is 60 to 65%. The left ventricle has hyperdynamic function. There is moderately increased left ventricular  hypertrophy.  2. Elevated left atrial and left ventricular end-diastolic pressures.  3. Left ventricular diastolic parameters are consistent with Grade I diastolic dysfunction (impaired relaxation).  4. The left ventricle has no regional wall motion abnormalities.  5. Global right ventricle has normal systolic function.The right ventricular size is normal. No increase in right ventricular wall thickness.  6. Left atrial size was mildly dilated.  7. Right atrial size was normal.  8. The mitral valve is abnormal. Mild mitral valve regurgitation.  9. The tricuspid valve is grossly normal. 10. The aortic valve is tricuspid. Aortic valve regurgitation is not visualized. Mild aortic valve sclerosis without stenosis. 11. The pulmonic valve was grossly normal. Pulmonic valve regurgitation is not visualized. 12. Moderately elevated pulmonary artery systolic pressure. 13. The inferior vena cava is dilated in size with >50% respiratory variability, suggesting right atrial pressure of 8 mmHg. FINDINGS  Left Ventricle: Left ventricular ejection fraction, by visual estimation, is 60 to 65%. The left ventricle has hyperdynamic function. The left ventricle has no regional wall motion abnormalities. There is moderately increased left ventricular hypertrophy. Left ventricular diastolic parameters are consistent with Grade I diastolic dysfunction (impaired relaxation). Elevated left atrial and left ventricular end-diastolic pressures. Right Ventricle: The right ventricular size is normal. No increase in right ventricular wall thickness. Global RV systolic function is has normal systolic function. The tricuspid regurgitant velocity is 3.11 m/s, and with an assumed right atrial pressure  of 8 mmHg, the estimated right ventricular systolic pressure is moderately elevated at 46.6 mmHg. Left Atrium: Left atrial size was mildly dilated. Right Atrium: Right atrial size was normal in size Pericardium: There is no evidence of pericardial  effusion. Mitral Valve: The mitral valve is abnormal. There is mild thickening of the mitral valve leaflet(s). Mild mitral valve regurgitation. Tricuspid Valve: The tricuspid valve is grossly normal. Tricuspid valve regurgitation is trivial. Aortic Valve: The aortic valve is tricuspid. Aortic valve regurgitation is not visualized. Mild aortic valve sclerosis is present, with no evidence of aortic valve stenosis. Pulmonic Valve: The pulmonic valve was grossly normal. Pulmonic valve regurgitation is not visualized. Pulmonic regurgitation is not visualized. Aorta: The aortic root and ascending aorta are structurally normal, with  no evidence of dilitation. Venous: The inferior vena cava is dilated in size with greater than 50% respiratory variability, suggesting right atrial pressure of 8 mmHg. IAS/Shunts: No atrial level shunt detected by color flow Doppler.  LEFT VENTRICLE PLAX 2D LVIDd:         5.90 cm  Diastology LVIDs:         3.20 cm  LV e' lateral:   5.98 cm/s LV PW:         1.30 cm  LV E/e' lateral: 17.2 LV IVS:        1.30 cm  LV e' medial:    3.81 cm/s LVOT diam:     2.00 cm  LV E/e' medial:  27.0 LV SV:         132 ml LV SV Index:   67.47 LVOT Area:     3.14 cm  RIGHT VENTRICLE RV Basal diam:  2.50 cm RV S prime:     13.90 cm/s TAPSE (M-mode): 3.0 cm LEFT ATRIUM             Index       RIGHT ATRIUM           Index LA diam:        3.80 cm 2.00 cm/m  RA Area:     12.50 cm LA Vol (A2C):   85.6 ml 44.98 ml/m RA Volume:   30.10 ml  15.82 ml/m LA Vol (A4C):   65.4 ml 34.36 ml/m LA Biplane Vol: 76.2 ml 40.04 ml/m  AORTIC VALVE LVOT Vmax:   111.00 cm/s LVOT Vmean:  81.300 cm/s LVOT VTI:    0.339 m  AORTA Ao Root diam: 3.40 cm MITRAL VALVE                         TRICUSPID VALVE MV Area (PHT): 3.72 cm              TR Peak grad:   38.6 mmHg MV PHT:        59.16 msec            TR Vmax:        348.00 cm/s MV Decel Time: 204 msec MV E velocity: 103.00 cm/s 103 cm/s  SHUNTS MV A velocity: 108.00 cm/s 70.3 cm/s  Systemic VTI:  0.34 m MV E/A ratio:  0.95        1.5       Systemic Diam: 2.00 cm  Lyman Bishop MD Electronically signed by Lyman Bishop MD Signature Date/Time: 07/02/2019/3:01:04 PM    Final         Scheduled Meds:  allopurinol  100 mg Oral Daily   amLODipine  10 mg Oral Daily   apixaban  2.5 mg Oral BID   furosemide  40 mg Intravenous BID   hydrALAZINE  50 mg Oral Q6H   insulin aspart  0-5 Units Subcutaneous QHS   insulin aspart  0-9 Units Subcutaneous TID WC   irbesartan  300 mg Oral Daily   isosorbide mononitrate  60 mg Oral Daily   levothyroxine  125 mcg Oral Daily   linagliptin  5 mg Oral Daily   Melatonin  3 mg Oral QHS   potassium chloride  40 mEq Oral BID   Continuous Infusions:  azithromycin Stopped (07/02/19 0756)   cefTRIAXone (ROCEPHIN)  IV Stopped (07/02/19 0624)     LOS: 0 days    Time spent: 20 minutes    [employer],  MD [facility] 07/02/2019, 6:27 PM     Please page though [place] or Epic secure chat:  For password, contact charge nurse

## 2019-07-02 NOTE — ED Provider Notes (Addendum)
TIME SEEN: 2:31 AM  CHIEF COMPLAINT: Palpitations  HPI: Patient is a 78 year old female with history of hypertension, diabetes, hyperlipidemia, A. fib who presents to the emergency department with [facility] for palpitations that woke her from sleep around 12:30 AM.  No chest pain or shortness of breath.  Has had lower extremity swelling bilaterally for the past 2 to 3 months.  She is not sure if she is on blood thinners but thinks that she is taking blood thinners.  States all of her care has been at [facility].  Was brought here as [facility] is on diversion from all EMS traffic.  She denies any fevers, cough, loss of smell, vomiting, diarrhea, sick contacts or Covid exposures.  States she has lost her sense of taste but that has been gone for 6 months.  Received 20 mg of diltiazem with EMS.  They report that she converted back to a sinus rhythm however their rhythm strip shows that patient was still in atrial fibrillation.  ROS: See HPI Constitutional: no fever  Eyes: no drainage  ENT: no runny nose   Cardiovascular:  no chest pain  Resp: no SOB  GI: no vomiting GU: no dysuria Integumentary: no rash  Allergy: no hives  Musculoskeletal: no leg swelling  Neurological: no slurred speech ROS otherwise negative  PAST MEDICAL HISTORY/PAST SURGICAL HISTORY:  Past Medical History:  Diagnosis Date  . Diabetes mellitus   . Hyperlipidemia   . Hypertension   . Obesity   . Thyroid disease     MEDICATIONS:  Prior to Admission medications   Medication Sig Start Date End Date Taking? Authorizing Provider  amLODipine (NORVASC) 10 MG tablet Take 10 mg by mouth daily.      [provider]  aspirin 81 MG tablet Take 81 mg by mouth daily.      [provider]  cloNIDine (CATAPRES - DOSED IN MG/24 HR) 0.2 mg/24hr patch Place 1 patch onto the skin once a week.      [provider]  cloNIDine (CATAPRES) 0.2 MG tablet Take 0.2 mg by mouth 3 (three) times  daily.      [provider]  furosemide (LASIX) 40 MG tablet Take 40 mg by mouth daily.      [provider]  hydrALAZINE (APRESOLINE) 50 MG tablet Take 50 mg by mouth every 6 (six) hours.      [provider]  insulin NPH-insulin regular (NOVOLIN 70/30) (70-30) 100 UNIT/ML injection Inject into the skin.      [provider]  lisinopril (PRINIVIL,ZESTRIL) 40 MG tablet Take 40 mg by mouth daily.      [provider]  METFORMIN HCL PO Take by mouth.      [provider]  OXYBUTYNIN TD Place onto the skin.      [provider]  SERTRALINE HCL PO Take by mouth.      [provider]  simvastatin (ZOCOR) 40 MG tablet Take 40 mg by mouth at bedtime.      [provider]  valsartan (DIOVAN) 320 MG tablet Take 320 mg by mouth daily.      [provider]    ALLERGIES:  No Known Allergies  SOCIAL HISTORY:  Social History   Tobacco Use  . Smoking status: Former Research scientist (life sciences)  . Smokeless tobacco: Never Used  . Tobacco comment: Quit >20 years ago  Substance Use Topics  . Alcohol use: No    FAMILY HISTORY: History reviewed. No pertinent family  history.  EXAM: BP (!) 174/82   Pulse (!) 116   Temp 97.7 F (36.5 C) (Oral)   Resp 17   Ht 5\' 6"  (1.676 m)   Wt 80.7 kg   SpO2 97%   BMI 28.73 kg/m  CONSTITUTIONAL: Alert and oriented and responds appropriately to questions.  Elderly, chronically ill-appearing HEAD: Normocephalic EYES: Conjunctivae clear, pupils appear equal, EOM appear intact ENT: normal nose; moist mucous membranes NECK: Supple, normal ROM CARD: Irregularly irregular and tachycardic; S1 and S2 appreciated; no murmurs, no clicks, no rubs, no gallops RESP: Normal chest excursion without splinting or tachypnea; breath sounds clear and equal bilaterally; no wheezes, no rhonchi, no rales, no hypoxia or respiratory distress, speaking full sentences ABD/GI: Normal bowel sounds; non-distended; soft,  non-tender, no rebound, no guarding, no peritoneal signs, no hepatosplenomegaly BACK:  The back appears normal EXT: Normal ROM in all joints; no deformity noted, 2+ pitting edema in bilateral lower extremities to the mid shin; no cyanosis SKIN: Normal color for age and race; warm; no rash on exposed skin NEURO: Moves all extremities equally PSYCH: The patient's mood and manner are appropriate.   MEDICAL DECISION MAKING: Patient here with A. fib with RVR.  Received 20 mg of diltiazem with EMS.  Patient did not spontaneously convert.  Will start diltiazem infusion.  No chest pressure tightness, shortness of breath.  Has lower extremity edema for the past 2 to 3 months.  She does not sure what medication she takes for her atrial fibrillation but states she thinks she is on an anticoagulant.  States her son gives her these medications and she does not keep track of it.  She normally receives all of her care at [facility].  ED PROGRESS: Patient still in A. fib with RVR on 5 of diltiazem.  Will increase to 10.  Troponin negative.  She does have an elevated creatinine of 2.4 with no old to compare to except in 2009.  Discussed this with patient who states she does not know if she has chronic kidney disease.  Will attempt to obtain records from [facility]  Chest x-ray also concerning for patchy airspace disease in the right midlung and infrahilar lung suspicious for pneumonia.  There is also a streaky retrocardiac opacity as well.  She denies any infectious symptoms.  No known Covid exposures.  Covid test pending.  I suspect that this may be secondary to volume overload.  Her BNP is 337.  Will hold Lasix at this time given she is not in distress until we can determine her baseline labs.  Have also requested most recent echocardiogram from [facility].  I do not feel she needs antibiotics as she does not have symptoms of pneumonia.  Will check rectal temperature.   4:15 AM Discussed patient's case  with hospitalist, Dr. Maudie Mercury.  I have recommended admission and patient (and family if present) agree with this plan. Admitting physician will place admission orders.   I reviewed all nursing notes, vitals, pertinent previous records and interpreted all EKGs, lab and urine results, imaging (as available).   6:00 AM  Patient has a normal rectal temp.  [facility] is sending over recent labs per Network engineer.  They do not have a recent echocardiogram.  Records received from [place].  It appears her last creatinine was 2.4 on 05/12/2019.  Patient is on metoprolol 25 mg daily, Eliquis 2.5 mg twice daily, carvedilol 3.125 mg daily.  CRITICAL CARE Performed by: Pryor Curia   Total critical care time:  45 minutes  Critical care time was exclusive of separately billable procedures and treating other patients.  Critical care was necessary to treat or prevent imminent or life-threatening deterioration.  Critical care was time spent personally by me on the following activities: development of treatment plan with patient and/or surrogate as well as nursing, discussions with consultants, evaluation of patient's response to treatment, examination of patient, obtaining history from patient or surrogate, ordering and performing treatments and interventions, ordering and review of laboratory studies, ordering and review of radiographic studies, pulse oximetry and re-evaluation of patient's condition.   DEAUN ROCHA was evaluated in Emergency Department on 07/02/2019 for the symptoms described in the history of present illness. She was evaluated in the context of the global COVID-19 pandemic, which necessitated consideration that the patient might be at risk for infection with the SARS-CoV-2 virus that causes COVID-19. Institutional protocols and algorithms that pertain to the evaluation of patients at risk for COVID-19 are in a state of rapid change based on information released by regulatory bodies  including the CDC and federal and state organizations. These policies and algorithms were followed during the patient's care in the ED.  Patient was seen wearing N95, face shield, gloves.    EKG Interpretation  Date/Time:  Monday July 02 2019 02:21:08 EST Ventricular Rate:  118 PR Interval:    QRS Duration: 90 QT Interval:  354 QTC Calculation: 496 R Axis:   71 Text Interpretation: Atrial fibrillation Anteroseptal infarct, old Repol abnrm suggests ischemia, diffuse leads A fib appears new compared to previous Confirmed by Khy Pitre, Cyril Mourning [phone]) on 07/02/2019 2:31:27 AM         Clorene Nerio, Delice Bison, DO 07/02/19 0449    Reshunda Strider, Delice Bison, DO 07/02/19 0600    Nijah Tejera, Delice Bison, DO 07/02/19 3291

## 2019-07-02 NOTE — H&P (Addendum)
TRH H&P    Patient Demographics:    Connie Cooley, is a 78 y.o. female  MRN: [id number]  DOB - [birth year]  Admit Date - 07/02/2019  Referring MD/NP/PA:  [facility]  Outpatient Primary MD for the patient is Lillard Anes, MD  Patient coming from:  home  Chief complaint- afib with RVR   HPI:    Connie Cooley  is a 78 y.o. female, w hypertension, hyperlipidemia, Dm2, CKD stage 3( baseline creatinine 0.9-3.2), CHF (diastolic, last echo at [facility] in 2012), Afib on Eliquis, presents with c/o afib with RVR, palpitations starting about 10 pm last nite.  Pt denies fever, chills, cough, cp, palp, sob, n/v, abd pain, diarrhea, brbpr, black stool.   Pt apparently on metformin per [facility] record. But patient states not taking  ?  In ED, T 97.7  P 107, Bp 162/92  Pox 98% on RA Wt 80.7kg   CXR IMPRESSION: 1. Patchy airspace disease in the right mid lung periphery and infrahilar lung suspicious for pneumonia. 2. Suspect some streaky retrocardiac opacity as well. 3. Background of hyperinflation with chronic interstitial changes   Na 136, K 3.7 Bun 35, Creatinine 2.64 Magnesium 2.0 Wbc 10.9, Hgb 11.0, Plt 182 Trop 12 BNP pending  ekg afib with RVR at 118, nl axis, st depression in 2,3, avf, v4-6 (no prior ekg for comparison)  Pt will be admitted for Afib with RVR and possible pneumonia r/o covid -19    Review of systems:    In addition to the HPI above,  No Fever-chills, No Headache, No changes with Vision or hearing, No problems swallowing food or Liquids, No Chest pain, Cough or Shortness of Breath, No Abdominal pain, No Nausea or Vomiting, bowel movements are regular, No Blood in stool or Urine, No dysuria, No new skin rashes or bruises, No new joints pains-aches,  No new weakness, tingling, numbness in any extremity, No recent weight gain or loss, No polyuria, polydypsia  or polyphagia, No significant Mental Stressors.  All other systems reviewed and are negative.    Past History of the following :    Past Medical History:  Diagnosis Date  . Atrial fibrillation ([place])   . CHF (congestive heart failure) ([place])   . CKD (chronic kidney disease), stage III   . Diabetes mellitus   . Hyperlipidemia   . Hypertension   . Obesity   . Thyroid disease       Past Surgical History:  Procedure Laterality Date  . CHOLECYSTECTOMY  2000  . PARTIAL HYSTERECTOMY  1977      Social History:      Social History   Tobacco Use  . Smoking status: Former Research scientist (life sciences)  . Smokeless tobacco: Never Used  . Tobacco comment: Quit >20 years ago  Substance Use Topics  . Alcohol use: No       Family History :     Family History  Problem Relation Age of Onset  . Leukemia Brother        Home Medications:   Prior  to Admission medications   Medication Sig Start Date End Date Taking? Authorizing Provider  amLODipine (NORVASC) 10 MG tablet Take 10 mg by mouth daily.      [provider]  aspirin 81 MG tablet Take 81 mg by mouth daily.      [provider]  cloNIDine (CATAPRES - DOSED IN MG/24 HR) 0.2 mg/24hr patch Place 1 patch onto the skin once a week.      [provider]  cloNIDine (CATAPRES) 0.2 MG tablet Take 0.2 mg by mouth 3 (three) times daily.      [provider]  furosemide (LASIX) 40 MG tablet Take 40 mg by mouth daily.      [provider]  hydrALAZINE (APRESOLINE) 50 MG tablet Take 50 mg by mouth every 6 (six) hours.      [provider]  insulin NPH-insulin regular (NOVOLIN 70/30) (70-30) 100 UNIT/ML injection Inject into the skin.      [provider]  lisinopril (PRINIVIL,ZESTRIL) 40 MG tablet Take 40 mg by mouth daily.      [provider]  METFORMIN HCL PO Take by mouth.      [provider]  OXYBUTYNIN TD Place onto the skin.      [provider]  SERTRALINE  HCL PO Take by mouth.      [provider]  simvastatin (ZOCOR) 40 MG tablet Take 40 mg by mouth at bedtime.      [provider]  valsartan (DIOVAN) 320 MG tablet Take 320 mg by mouth daily.      [provider]     Allergies:     Allergies  Allergen Reactions  . Metformin And Related Diarrhea     Physical Exam:   Vitals  Blood pressure (!) 174/82, pulse (!) 116, temperature 97.7 F (36.5 C), temperature source Oral, resp. rate 17, height 5\' 6"  (1.676 m), weight 80.7 kg, SpO2 97 %.  1.  General: axoxo3  2. Psychiatric: euthymic  3. Neurologic: Cn 2-12 intact, reflexes 2+ symmetric, diffuse with no clonus, motor 5/5 in all 4 ext  4. HEENMT:  Anicteric, pupils 1.27mm symmetric, direct, consensual, intact Neck: no jvd  5. Respiratory : Slight crackles right lung base, no wheeze  6. Cardiovascular : Irr, irr, s1, s2, no m/g/r  7. Gastrointestinal:  Abd: soft, nt, nd, +bs  8. Skin:  Ext: no c/c,  Very slight edema bilateral lower ext, no erythema, no warmth  9.Musculoskeletal:  Good ROM      Data Review:    CBC Recent Labs  Lab 07/02/19 0224  WBC 10.9*  HGB 11.0*  HCT 33.9*  PLT 182  MCV 86.5  MCH 28.1  MCHC 32.4  RDW 14.7   ------------------------------------------------------------------------------------------------------------------  Results for orders placed or performed during the hospital encounter of 07/02/19 (from the past 48 hour(s))  TSH     Status: None   Collection Time: 07/02/19  2:22 AM  Result Value Ref Range   TSH 2.380 0.350 - 4.500 uIU/mL    Comment: Performed by a 3rd Generation assay with a functional sensitivity of <=0.01 uIU/mL. Performed at [facility], [place] [address] [place], [place] [address]   Basic metabolic panel     Status: Abnormal   Collection Time: 07/02/19  2:24 AM  Result Value Ref Range   Sodium 136 135 - 145 mmol/L   Potassium 3.7 3.5 - 5.1 mmol/L   Chloride  105 98 - 111 mmol/L  CO2 18 (L) 22 - 32 mmol/L   Glucose, Bld 317 (H) 70 - 99 mg/dL   BUN 35 (H) 8 - 23 mg/dL   Creatinine, Ser 2.64 (H) 0.44 - 1.00 mg/dL   Calcium 9.0 8.9 - 10.3 mg/dL   GFR calc non Af Amer 17 (L) >60 mL/min   GFR calc Af Amer 19 (L) >60 mL/min   Anion gap 13 5 - 15    Comment: Performed at [facility], [place] [address], [place], [place] [address]  CBC     Status: Abnormal   Collection Time: 07/02/19  2:24 AM  Result Value Ref Range   WBC 10.9 (H) 4.0 - 10.5 K/uL   RBC 3.92 3.87 - 5.11 MIL/uL   Hemoglobin 11.0 (L) 12.0 - 15.0 g/dL   HCT 33.9 (L) 36.0 - 46.0 %   MCV 86.5 80.0 - 100.0 fL   MCH 28.1 26.0 - 34.0 pg   MCHC 32.4 30.0 - 36.0 g/dL   RDW 14.7 11.5 - 15.5 %   Platelets 182 150 - 400 K/uL   nRBC 0.0 0.0 - 0.2 %    Comment: Performed at [facility], [place] [address], [place], [place] [address]  Troponin I (High Sensitivity)     Status: None   Collection Time: 07/02/19  2:24 AM  Result Value Ref Range   Troponin I (High Sensitivity) 12 <18 ng/L    Comment: (NOTE) Elevated high sensitivity troponin I (hsTnI) values and significant  changes across serial measurements may suggest ACS but many other  chronic and acute conditions are known to elevate hsTnI results.  Refer to the "Links" section for chest pain algorithms and additional  guidance. Performed at [facility], [place] [address] [place], [place] [address]   Brain natriuretic peptide     Status: Abnormal   Collection Time: 07/02/19  2:24 AM  Result Value Ref Range   B Natriuretic Peptide 337.3 (H) 0.0 - 100.0 pg/mL    Comment: Performed at [facility], [place] [address], [place], [place] [address]  Magnesium     Status: None   Collection Time: 07/02/19  2:24 AM  Result Value Ref Range   Magnesium 2.0 1.7 - 2.4 mg/dL    Comment: Performed at [facility], [place] [address], [place], [place] [address]     Chemistries  Recent Labs  Lab 07/02/19 0224  NA 136  K 3.7  CL 105  CO2 18*  GLUCOSE 317*  BUN 35*  CREATININE 2.64*  CALCIUM 9.0  MG 2.0   ------------------------------------------------------------------------------------------------------------------  ------------------------------------------------------------------------------------------------------------------ GFR: Estimated Creatinine Clearance: 18.8 mL/min (A) (by C-G formula based on SCr of 2.64 mg/dL (H)). Liver Function Tests: No results for input(s): AST, ALT, ALKPHOS, BILITOT, PROT, ALBUMIN in the last 168 hours. No results for input(s): LIPASE, AMYLASE in the last 168 hours. No results for input(s): AMMONIA in the last 168 hours. Coagulation Profile: No results for input(s): INR, PROTIME in the last 168 hours. Cardiac Enzymes: No results for input(s): CKTOTAL, CKMB, CKMBINDEX, TROPONINI in the last 168 hours. BNP (last 3 results) No results for input(s): PROBNP in the last 8760 hours. HbA1C: No results for input(s): HGBA1C in the last 72 hours. CBG: No results for input(s): GLUCAP in the last 168 hours. Lipid Profile: No results for input(s): CHOL, HDL, LDLCALC, TRIG, CHOLHDL, LDLDIRECT in the last 72 hours. Thyroid Function Tests: Recent Labs    07/02/19 0222  TSH 2.380  Anemia Panel: No results for input(s): VITAMINB12, FOLATE, FERRITIN, TIBC, IRON, RETICCTPCT in the last 72 hours.  --------------------------------------------------------------------------------------------------------------- Urine analysis: No results found for: COLORURINE, APPEARANCEUR, LABSPEC, PHURINE, GLUCOSEU, HGBUR, BILIRUBINUR, KETONESUR, PROTEINUR, UROBILINOGEN, NITRITE, LEUKOCYTESUR    Imaging Results:    DG Chest 2 View  Result Date: 07/02/2019 CLINICAL DATA:  Chest pain, tachycardia, history of CHF and hypertension EXAM: CHEST - 2 VIEW COMPARISON:  Radiograph 05/12/2019, CT 09/28/2016 FINDINGS: Hyperinflation of  the lungs with flattening of the diaphragms and chronic interstitial changes in the bases are similar to prior. Patchy airspace disease is noted in the right mid lung periphery and infrahilar lung with some air bronchograms. Suspect some streaky retrocardiac opacity as well. No pneumothorax. No effusion. The aorta is calcified. The remaining cardiomediastinal contours are unremarkable. No acute osseous or soft tissue abnormality. Degenerative changes are present in the imaged spine and shoulders. Telemetry leads overlie the chest. IMPRESSION: 1. Patchy airspace disease in the right mid lung periphery and infrahilar lung suspicious for pneumonia. 2. Suspect some streaky retrocardiac opacity as well. 3. Background of hyperinflation with chronic interstitial changes Electronically Signed   By: Lovena Le M.D.   On: 07/02/2019 02:53       Assessment & Plan:    Principal Problem:   Atrial fibrillation with RVR ([place]) Active Problems:   Essential hypertension   Hyperlipidemia   Diabetes (HCC)   CKD (chronic kidney disease), stage III  Afib with RVR Tele Trop I  Check Tsh Check cardiac echo  cardizem gtt eliquis pharmacy to dose  ARF on CKD stage3 Hydrate with ns at 50 mL per hour x 5 hours Check bmp at 10 am cmp in am tomorrow  Pneumonia Blood culture x2 Urine strep antigen Urine legionella antigen Rocephin 2gm iv qday Zithromax 500mg  iv qday  Non- AG acidosis likely secondary to being on metformin in setting of ARF on CKD Check lactic acid STOP Metformin   Hypertension STOP Irbesartan ? (think patient is on this) Hydralazine 50mg  po q6h  Please find out medication list in AM  Hyperlipidemia Unclear if taking Amlodipine/ Cardizem/ Simvastatin, (note there is a drug interaction between these two) Will use Lipitor 40mg  po qhs Please find out medication list in am  DM2 fsbs ac and qhs, ISS  Please find out what medications patient is taking this AM  DVT Prophylaxis-    Eliquis  AM Labs Ordered, also please review Full Orders  Family Communication: Admission, patients condition and plan of care including tests being ordered have been discussed with the patient  who indicate understanding and agree with the plan and Code Status.  Code Status:  FULL CODE per patient, left message for son at [phone] that patient has been admitted to [facility]  Admission status:   Inpatient: Based on patients clinical presentation and evaluation of above clinical data, I have made determination that patient meets Inpatient criteria at this time.   Time spent in minutes : 60 minutes critical care time   Jani Gravel M.D on 07/02/2019 at 5:12 AM

## 2019-07-02 NOTE — Plan of Care (Signed)

## 2019-07-02 NOTE — Progress Notes (Signed)
Echocardiogram 2D Echocardiogram has been performed.  Connie Cooley 07/02/2019, 2:03 PM

## 2019-07-02 NOTE — ED Notes (Addendum)
Removed old [place] and placed a new one. Brief is currently dry. Pt reports she would like to brush her teeth. Pt given oral hygiene products. No other concerns expressed at this time.

## 2019-07-02 NOTE — Progress Notes (Signed)
ANTICOAGULATION CONSULT NOTE - Initial Consult  Pharmacy Consult for apixaban Indication: atrial fibrillation  Allergies  Allergen Reactions  . Metformin And Related Diarrhea    Patient Measurements: Height: 5\' 6"  (167.6 cm) Weight: 178 lb (80.7 kg) IBW/kg (Calculated) : 59.3   Vital Signs: Temp: 97.6 F (36.4 C) (12/28 0552) Temp Source: Rectal (12/28 0552) BP: 197/83 (12/28 0855) Pulse Rate: 64 (12/28 0855)  Labs: Recent Labs    07/02/19 0224 07/02/19 0447  HGB 11.0* 10.4*  HCT 33.9* 32.7*  PLT 182 162  CREATININE 2.64* 2.63*  TROPONINIHS 12 15    Estimated Creatinine Clearance: 18.9 mL/min (A) (by C-G formula based on SCr of 2.63 mg/dL (H)).   Medical History: Past Medical History:  Diagnosis Date  . Atrial fibrillation ([place])   . CHF (congestive heart failure) ([place])   . CKD (chronic kidney disease), stage III   . Diabetes mellitus   . Hyperlipidemia   . Hypertension   . Obesity   . Thyroid disease     Medications:  Scheduled:  . apixaban  2.5 mg Oral BID  . furosemide  40 mg Oral Daily  . hydrALAZINE  50 mg Oral Q6H  . insulin aspart  0-5 Units Subcutaneous QHS  . insulin aspart  0-9 Units Subcutaneous TID WC  . metoprolol tartrate  25 mg Oral BID   Infusions:  . azithromycin 500 mg (07/02/19 0656)  . cefTRIAXone (ROCEPHIN)  IV Stopped (07/02/19 0677)    Assessment: 12 yoF admitted in a-fib with rvr.  *Med hx now completed and patient was reportedly taking apixaban 2.5 mg po BID prior to admission.  Will therefore resume this dosage.   Age, wt, current SCr are noted.     Plan:  apixaban 2.5 mg bid  Clayburn Pert, PharmD, BCPS 07/02/2019  9:25 AM

## 2019-07-02 NOTE — ED Notes (Signed)
NT unable to collect 2nd blood culture. RN notified.

## 2019-07-02 NOTE — Progress Notes (Signed)
ANTICOAGULATION CONSULT NOTE - Initial Consult  Pharmacy Consult for apixaban Indication: atrial fibrillation  No Known Allergies  Patient Measurements: Height: 5\' 6"  (167.6 cm) Weight: 178 lb (80.7 kg) IBW/kg (Calculated) : 59.3   Vital Signs: Temp: 97.7 F (36.5 C) (12/28 0231) Temp Source: Oral (12/28 0231) BP: 174/82 (12/28 0431) Pulse Rate: 116 (12/28 0431)  Labs: Recent Labs    07/02/19 0224  HGB 11.0*  HCT 33.9*  PLT 182  CREATININE 2.64*  TROPONINIHS 12    Estimated Creatinine Clearance: 18.8 mL/min (A) (by C-G formula based on SCr of 2.64 mg/dL (H)).   Medical History: Past Medical History:  Diagnosis Date  . Atrial fibrillation ([place])   . CHF (congestive heart failure) ([place])   . CKD (chronic kidney disease), stage III   . Diabetes mellitus   . Hyperlipidemia   . Hypertension   . Obesity   . Thyroid disease     Medications:  Scheduled:  . apixaban  5 mg Oral BID  . insulin aspart  0-5 Units Subcutaneous QHS  . insulin aspart  0-9 Units Subcutaneous TID WC  . sodium chloride flush  3 mL Intravenous Once   Infusions:  . diltiazem (CARDIZEM) infusion 10 mg/hr (07/02/19 0425)    Assessment: 78 yoF admitted in a-fib with rvr. Med rec not completed. MD ordering apixaban. Scr = 2.64, wt = 80 kg, age = 45    Plan:  apixaban 5 mg bid   Dorrene German 07/02/2019,4:58 AM

## 2019-07-02 NOTE — ED Notes (Signed)
Pt provided with lunch tray.

## 2019-07-02 NOTE — ED Notes (Signed)
Ward, MD notified of pt's rectal temp of 97.6.

## 2019-07-03 DIAGNOSIS — N184 Chronic kidney disease, stage 4 (severe): Secondary | ICD-10-CM

## 2019-07-03 DIAGNOSIS — I5033 Acute on chronic diastolic (congestive) heart failure: Secondary | ICD-10-CM

## 2019-07-03 LAB — CBC
HCT: 33.9 % — ABNORMAL LOW (ref 36.0–46.0)
Hemoglobin: 10.9 g/dL — ABNORMAL LOW (ref 12.0–15.0)
MCH: 27.9 pg (ref 26.0–34.0)
MCHC: 32.2 g/dL (ref 30.0–36.0)
MCV: 86.7 fL (ref 80.0–100.0)
Platelets: 141 10*3/uL — ABNORMAL LOW (ref 150–400)
RBC: 3.91 MIL/uL (ref 3.87–5.11)
RDW: 14.7 % (ref 11.5–15.5)
WBC: 9.2 10*3/uL (ref 4.0–10.5)
nRBC: 0 % (ref 0.0–0.2)

## 2019-07-03 LAB — BASIC METABOLIC PANEL
Anion gap: 9 (ref 5–15)
BUN: 35 mg/dL — ABNORMAL HIGH (ref 8–23)
CO2: 19 mmol/L — ABNORMAL LOW (ref 22–32)
Calcium: 8.8 mg/dL — ABNORMAL LOW (ref 8.9–10.3)
Chloride: 108 mmol/L (ref 98–111)
Creatinine, Ser: 2.6 mg/dL — ABNORMAL HIGH (ref 0.44–1.00)
GFR calc Af Amer: 20 mL/min — ABNORMAL LOW (ref >60)
GFR calc non Af Amer: 17 mL/min — ABNORMAL LOW (ref >60)
Glucose, Bld: 253 mg/dL — ABNORMAL HIGH (ref 70–99)
Potassium: 3.5 mmol/L (ref 3.5–5.1)
Sodium: 136 mmol/L (ref 135–145)

## 2019-07-03 LAB — GLUCOSE, CAPILLARY
Glucose-Capillary: 258 mg/dL — ABNORMAL HIGH (ref 70–99)
Glucose-Capillary: 238 mg/dL — ABNORMAL HIGH (ref 70–99)

## 2019-07-03 LAB — PROCALCITONIN: Procalcitonin: <0.1 ng/mL

## 2019-07-03 NOTE — TOC Transition Note (Signed)
Transition of Care [facility]) - CM/SW Discharge Note   Patient Details  Name: Connie Cooley MRN: [id number] Date of Birth: [birth year]  [facility] of [facility]) CM/SW Contact:  Wende Neighbors, LCSW Phone Number: 07/03/2019, 11:42 AM   Clinical Narrative:   Patient discharging home with home health following. Patient was being followed by [facility] and they will continue to follow patient at discharge. CSW spoke with Dorian Pod at [facility] and resumption order for PT,OT and Nursing was fax to her. Dorian Pod aware that patient is discharging back home today           Patient Goals and CMS Choice        Discharge Placement                       Discharge Plan and Services                                     Social Determinants of Health (SDOH) Interventions     Readmission Risk Interventions No flowsheet data found.

## 2019-07-03 NOTE — Discharge Summary (Signed)
Physician Discharge Summary  Connie Cooley DXI:[id number] DOB: 1941/02/07 DOA: 07/02/2019  PCP: Lillard Anes, MD  Admit date: 07/02/2019 Discharge date: 07/03/2019  Admitted From: Home  Disposition:  Home with [facility]   Recommendations for Outpatient Follow-up:  1. Follow up with PCP Dr. Henrene Pastor in 1-2 weeks 2. Dr. Henrene Pastor: Please obtain BMP in one week 3. If not referred to Nephrology for CKD IV, please consider at this time       Home Health: PT/OT due to dyspnea with minimal exertion   Equipment/Devices: Walker  Discharge Condition: Fair  CODE STATUS: FULL Diet recommendation: Cardiac, diabetic  Brief/Interim Summary: Mrs. Connie Cooley is a 78 y.o. 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  In the ER, found to have Afib with RVR.  Started on diltiazem infusion, rate slowed.  CXR noted to have bilateral infiltrates, started on empiric antibiotics, COVID negative.  Also noted to have BP > 200/100 mmHg, leg swelling.     PRINCIPAL HOSPITAL DIAGNOSIS: Acute diastolic CHF due to Afib with RVR    Discharge Diagnoses:   Chronic atrial fibrillation with RVR Admitted and started on diltiazem.  Rates controlled overnight, transitioned to oral metoprolol.    Remained in Afib.  Eliquis continued and rates remained stable on oral nodal blocking agents.      Pneumonia, ruled out COVID negative.  Procalcitonin negative, WBC normal and afebrile.  Likely infiltrates on CXR were pulmonary edema.    Hypertensive urgency BP >200/100 mmHg at admission.  Home medicines were restarted and BP normalized.  Likely medication nonadherence.  [place] RN ordered at discharge for medication assistance.  Acute on chronic diastolic CHF Echocardiogram obtained, showed grade I DD, normal EF, normal valves.  Treated with IV Lasix and leg swelling resolved. Cr and K stable.  Resumed home Lasix at discharge.   Diabetes  CKD IV AKI  ruled out Metabolic acidosis Baseline Cr is 2.4 to 2.5 per outside records, stable relative to baseline here.  Recommend Nephrology follow up given GFR ~15.  Gout  Hypothyroidism  Anemia of CKD IV Stable relative to baseline         Discharge Instructions  Discharge Instructions    Diet - low sodium heart healthy   Complete by: As directed    Discharge instructions   Complete by: As directed    From Dr. Loleta Books: You were admitted with fast heart rate and chest discomfort.  It turned out this was from your Afib being very fast. In addition, it looked like this had caused an episode of congestive heart failure.  We treated the heart rate with slowing medicines and slowed your rate. We treated the congestive heart failure with Lasix/diuretics and your symptoms were better.  You should resume your home medicines for your Afib: Metoprolol Eliquis --> but increase the dose here to 5 mg twice daily  You should resume your home medicines for blood pressure: Amlodipine Hydralazine Irbesartan Imdur Metoprolol Furosemide   Call Dr. Henrene Pastor asap for a folow up appointment Have him check your lab work in 1 week  Ask him about a referral to a kidney specialist   Increase activity slowly   Complete by: As directed      Allergies as of 07/03/2019      Reactions   Metformin And Related Diarrhea      Medication List    TAKE these medications   allopurinol 100 MG tablet Commonly known as: ZYLOPRIM  Take 100 mg by mouth daily.   amLODipine 10 MG tablet Commonly known as: NORVASC Take 10 mg by mouth daily.   Eliquis 2.5 MG Tabs tablet Generic drug: apixaban Take 2.5 mg by mouth 2 (two) times daily.   furosemide 40 MG tablet Commonly known as: LASIX Take 40 mg by mouth daily.   hydrALAZINE 100 MG tablet Commonly known as: APRESOLINE Take 100 mg by mouth 3 (three) times daily.   insulin NPH-regular Human (70-30) 100 UNIT/ML injection Inject 3-7 Units into  the skin 2 (two) times daily with a meal. Sliding scale Take 7 units in the morning and 3 units in the evening   irbesartan 300 MG tablet Commonly known as: AVAPRO Take 300 mg by mouth daily.   isosorbide mononitrate 60 MG 24 hr tablet Commonly known as: IMDUR Take 60 mg by mouth daily.   Januvia 100 MG tablet Generic drug: sitaGLIPtin Take 100 mg by mouth daily.   levothyroxine 125 MCG tablet Commonly known as: SYNTHROID Take 125 mcg by mouth daily.   Melatonin 3 MG Tabs Take 3 mg by mouth at bedtime.   metoprolol succinate 25 MG 24 hr tablet Commonly known as: TOPROL-XL Take 25 mg by mouth daily.   PROBIOTIC DAILY PO Take 1 tablet by mouth daily.   vitamin B-12 1000 MCG tablet Commonly known as: CYANOCOBALAMIN Take 1,000 mcg by mouth daily.   Vitamin D (Ergocalciferol) 1.25 MG (50000 UT) Caps capsule Commonly known as: DRISDOL Take 50,000 Units by mouth every 30 (thirty) days.      Follow-up Information    Lillard Anes, MD. Schedule an appointment as soon as possible for a visit in 1 week(s).   Specialty: Family Medicine Contact information: [address] [place] [address] [facility].   Specialty: Home Health Services Why: Follwo up for physical therapy, occupational  therapy and nursing needs  Contact information: [address] [place] [address] [phone]          Allergies  Allergen Reactions  . Metformin And Related Diarrhea    Consultations:     Procedures/Studies: DG Chest 2 View  Result Date: 07/02/2019 CLINICAL DATA:  Chest pain, tachycardia, history of CHF and hypertension EXAM: CHEST - 2 VIEW COMPARISON:  Radiograph 05/12/2019, CT 09/28/2016 FINDINGS: Hyperinflation of the lungs with flattening of the diaphragms and chronic interstitial changes in the bases are similar to prior. Patchy airspace disease is noted in the right mid lung periphery and infrahilar  lung with some air bronchograms. Suspect some streaky retrocardiac opacity as well. No pneumothorax. No effusion. The aorta is calcified. The remaining cardiomediastinal contours are unremarkable. No acute osseous or soft tissue abnormality. Degenerative changes are present in the imaged spine and shoulders. Telemetry leads overlie the chest. IMPRESSION: 1. Patchy airspace disease in the right mid lung periphery and infrahilar lung suspicious for pneumonia. 2. Suspect some streaky retrocardiac opacity as well. 3. Background of hyperinflation with chronic interstitial changes Electronically Signed   By: Lovena Le M.D.   On: 07/02/2019 02:53   ECHOCARDIOGRAM COMPLETE  Result Date: 07/02/2019   ECHOCARDIOGRAM REPORT   Patient Name:   Connie Cooley Date of Exam: 07/02/2019 Medical Rec #:  [id number]       Height:       66.0 in Accession #:    [id number]      Weight:       178.0  lb Date of Birth:  1941-01-14      BSA:          1.90 m Patient Age:    78 years        BP:           176/64 mmHg Patient Gender: F               HR:           51 bpm. Exam Location:  Inpatient Procedure: 2D Echo, Cardiac Doppler and Color Doppler Indications:    Atrial fibrillation  History:        Patient has no prior history of Echocardiogram examinations.                 CHF, Arrythmias:Atrial Fibrillation; Risk Factors:Hypertension,                 Diabetes and Dyslipidemia. CKD.  Sonographer:    Dustin Flock Referring Phys: [place]  1. Left ventricular ejection fraction, by visual estimation, is 60 to 65%. The left ventricle has hyperdynamic function. There is moderately increased left ventricular hypertrophy.  2. Elevated left atrial and left ventricular end-diastolic pressures.  3. Left ventricular diastolic parameters are consistent with Grade I diastolic dysfunction (impaired relaxation).  4. The left ventricle has no regional wall motion abnormalities.  5. Global right ventricle has normal systolic  function.The right ventricular size is normal. No increase in right ventricular wall thickness.  6. Left atrial size was mildly dilated.  7. Right atrial size was normal.  8. The mitral valve is abnormal. Mild mitral valve regurgitation.  9. The tricuspid valve is grossly normal. 10. The aortic valve is tricuspid. Aortic valve regurgitation is not visualized. Mild aortic valve sclerosis without stenosis. 11. The pulmonic valve was grossly normal. Pulmonic valve regurgitation is not visualized. 12. Moderately elevated pulmonary artery systolic pressure. 13. The inferior vena cava is dilated in size with >50% respiratory variability, suggesting right atrial pressure of 8 mmHg. FINDINGS  Left Ventricle: Left ventricular ejection fraction, by visual estimation, is 60 to 65%. The left ventricle has hyperdynamic function. The left ventricle has no regional wall motion abnormalities. There is moderately increased left ventricular hypertrophy. Left ventricular diastolic parameters are consistent with Grade I diastolic dysfunction (impaired relaxation). Elevated left atrial and left ventricular end-diastolic pressures. Right Ventricle: The right ventricular size is normal. No increase in right ventricular wall thickness. Global RV systolic function is has normal systolic function. The tricuspid regurgitant velocity is 3.11 m/s, and with an assumed right atrial pressure  of 8 mmHg, the estimated right ventricular systolic pressure is moderately elevated at 46.6 mmHg. Left Atrium: Left atrial size was mildly dilated. Right Atrium: Right atrial size was normal in size Pericardium: There is no evidence of pericardial effusion. Mitral Valve: The mitral valve is abnormal. There is mild thickening of the mitral valve leaflet(s). Mild mitral valve regurgitation. Tricuspid Valve: The tricuspid valve is grossly normal. Tricuspid valve regurgitation is trivial. Aortic Valve: The aortic valve is tricuspid. Aortic valve regurgitation is  not visualized. Mild aortic valve sclerosis is present, with no evidence of aortic valve stenosis. Pulmonic Valve: The pulmonic valve was grossly normal. Pulmonic valve regurgitation is not visualized. Pulmonic regurgitation is not visualized. Aorta: The aortic root and ascending aorta are structurally normal, with no evidence of dilitation. Venous: The inferior vena cava is dilated in size with greater than 50% respiratory variability, suggesting right atrial pressure of 8 mmHg. IAS/Shunts: No  atrial level shunt detected by color flow Doppler.  LEFT VENTRICLE PLAX 2D LVIDd:         5.90 cm  Diastology LVIDs:         3.20 cm  LV e' lateral:   5.98 cm/s LV PW:         1.30 cm  LV E/e' lateral: 17.2 LV IVS:        1.30 cm  LV e' medial:    3.81 cm/s LVOT diam:     2.00 cm  LV E/e' medial:  27.0 LV SV:         132 ml LV SV Index:   67.47 LVOT Area:     3.14 cm  RIGHT VENTRICLE RV Basal diam:  2.50 cm RV S prime:     13.90 cm/s TAPSE (M-mode): 3.0 cm LEFT ATRIUM             Index       RIGHT ATRIUM           Index LA diam:        3.80 cm 2.00 cm/m  RA Area:     12.50 cm LA Vol (A2C):   85.6 ml 44.98 ml/m RA Volume:   30.10 ml  15.82 ml/m LA Vol (A4C):   65.4 ml 34.36 ml/m LA Biplane Vol: 76.2 ml 40.04 ml/m  AORTIC VALVE LVOT Vmax:   111.00 cm/s LVOT Vmean:  81.300 cm/s LVOT VTI:    0.339 m  AORTA Ao Root diam: 3.40 cm MITRAL VALVE                         TRICUSPID VALVE MV Area (PHT): 3.72 cm              TR Peak grad:   38.6 mmHg MV PHT:        59.16 msec            TR Vmax:        348.00 cm/s MV Decel Time: 204 msec MV E velocity: 103.00 cm/s 103 cm/s  SHUNTS MV A velocity: 108.00 cm/s 70.3 cm/s Systemic VTI:  0.34 m MV E/A ratio:  0.95        1.5       Systemic Diam: 2.00 cm  Lyman Bishop MD Electronically signed by Lyman Bishop MD Signature Date/Time: 07/02/2019/3:01:04 PM    Final        Subjective: Leg swelling resolved.  No dyspnea, orthopnea, palpitations, cough, sputum, fever,  malaise.  Discharge Exam: Vitals:   07/02/19 2042 07/03/19 0536  BP: (!) 185/58 (!) 195/84  Pulse: (!) 53 (!) 57  Resp: 18 18  Temp: 98.5 F (36.9 C) 98 F (36.7 C)  SpO2: 97% 98%   Vitals:   07/02/19 1800 07/02/19 1842 07/02/19 2042 07/03/19 0536  BP: (!) 191/85 (!) 179/61 (!) 185/58 (!) 195/84  Pulse: (!) 52 (!) 55 (!) 53 (!) 57  Resp: 16 19 18 18   Temp:  98.3 F (36.8 C) 98.5 F (36.9 C) 98 F (36.7 C)  TempSrc:  Oral    SpO2: 98% 98% 97% 98%  Weight:      Height:        General: Pt is alert, awake, not in acute distress Cardiovascular: Irregularly irregular, nl S1-S2, no murmurs appreciated.   No LE edema.   Respiratory: Normal respiratory rate and rhythm.  CTAB without rales or wheezes. Abdominal: Abdomen soft and non-tender.  No  distension or HSM.   Neuro/Psych: Strength symmetric in upper and lower extremities.  Judgment and insight appear normal.   The results of significant diagnostics from this hospitalization (including imaging, microbiology, ancillary and laboratory) are listed below for reference.     Microbiology: Recent Results (from the past 240 hour(s))  SARS CORONAVIRUS 2 (TAT 6-24 HRS) Nasopharyngeal Nasopharyngeal Swab     Status: None   Collection Time: 07/02/19  4:47 AM   Specimen: Nasopharyngeal Swab  Result Value Ref Range Status   SARS Coronavirus 2 NEGATIVE NEGATIVE Final    Comment: (NOTE) SARS-CoV-2 target nucleic acids are NOT DETECTED. The SARS-CoV-2 RNA is generally detectable in upper and lower respiratory specimens during the acute phase of infection. Negative results do not preclude SARS-CoV-2 infection, do not rule out co-infections with other pathogens, and should not be used as the sole basis for treatment or other patient management decisions. Negative results must be combined with clinical observations, patient history, and epidemiological information. The expected result is Negative. Fact Sheet for  Patients: SugarRoll.be Fact Sheet for Healthcare Providers: https://www.woods-mathews.com/ This test is not yet approved or cleared by the [place] FDA and  has been authorized for detection and/or diagnosis of SARS-CoV-2 by FDA under an Emergency Use Authorization (EUA). This EUA will remain  in effect (meaning this test can be used) for the duration of the COVID-19 declaration under Section 56 4(b)(1) of the Act, 21 U.S.C. section 360bbb-3(b)(1), unless the authorization is terminated or revoked sooner. Performed at [facility], [place] [address] [place], [place] [address]   Culture, blood (routine x 2)     Status: None (Preliminary result)   Collection Time: 07/02/19 12:15 PM   Specimen: BLOOD LEFT HAND  Result Value Ref Range Status   Specimen Description   Final    BLOOD LEFT HAND Performed at [facility] [address] [place], [place] [address]    Special Requests   Final    BOTTLES DRAWN AEROBIC AND ANAEROBIC Blood Culture adequate volume Performed at [facility] [address], [place], [place] [address]    Culture   Final    NO GROWTH < 24 HOURS Performed at [facility] [address], [place], [place] [address]    Report Status PENDING  Incomplete  Culture, blood (routine x 2)     Status: None (Preliminary result)   Collection Time: 07/02/19  7:11 PM   Specimen: BLOOD  Result Value Ref Range Status   Specimen Description   Final    BLOOD BLOOD LEFT HAND Performed at [facility] [address] [place], [place] [address]    Special Requests   Final    BOTTLES DRAWN AEROBIC ONLY Blood Culture adequate volume Performed at [facility] [address], [place], [place] [address]    Culture   Final    NO GROWTH < 24 HOURS Performed at [facility] [address] [place], [place] [address]    Report Status PENDING   Incomplete     Labs: BNP (last 3 results) Recent Labs    07/02/19 0224  BNP 546.5*   Basic Metabolic Panel: Recent Labs  Lab 07/02/19 0224 07/02/19 0447 07/03/19 0522  NA 136 137 136  K 3.7 3.4* 3.5  CL 105 107 108  CO2 18* 20* 19*  GLUCOSE 317* 308* 253*  BUN 35* 33* 35*  CREATININE 2.64* 2.63* 2.60*  CALCIUM 9.0 8.8*  8.8*  MG 2.0  --   --    Liver Function Tests: Recent Labs  Lab 07/02/19 0447  AST 12*  ALT 12  ALKPHOS 57  BILITOT 0.6  PROT 6.4*  ALBUMIN 2.9*   No results for input(s): LIPASE, AMYLASE in the last 168 hours. No results for input(s): AMMONIA in the last 168 hours. CBC: Recent Labs  Lab 07/02/19 0224 07/02/19 0447 07/03/19 0522  WBC 10.9* 8.8 9.2  HGB 11.0* 10.4* 10.9*  HCT 33.9* 32.7* 33.9*  MCV 86.5 86.7 86.7  PLT 182 162 141*   Cardiac Enzymes: No results for input(s): CKTOTAL, CKMB, CKMBINDEX, TROPONINI in the last 168 hours. BNP: Invalid input(s): POCBNP CBG: Recent Labs  Lab 07/02/19 1143 07/02/19 1803 07/02/19 2055 07/03/19 0737 07/03/19 1225  GLUCAP 193* 226* 219* 258* 238*   D-Dimer No results for input(s): DDIMER in the last 72 hours. Hgb A1c No results for input(s): HGBA1C in the last 72 hours. Lipid Profile No results for input(s): CHOL, HDL, LDLCALC, TRIG, CHOLHDL, LDLDIRECT in the last 72 hours. Thyroid function studies Recent Labs    07/02/19 0222  TSH 2.380   Anemia work up No results for input(s): VITAMINB12, FOLATE, FERRITIN, TIBC, IRON, RETICCTPCT in the last 72 hours. Urinalysis    Component Value Date/Time   COLORURINE YELLOW 07/02/2019 0425   APPEARANCEUR CLEAR 07/02/2019 0425   LABSPEC 1.015 07/02/2019 0425   PHURINE 5.0 07/02/2019 0425   GLUCOSEU >=500 (A) 07/02/2019 0425   HGBUR NEGATIVE 07/02/2019 0425   BILIRUBINUR NEGATIVE 07/02/2019 0425   KETONESUR NEGATIVE 07/02/2019 0425   PROTEINUR >=300 (A) 07/02/2019 0425   NITRITE NEGATIVE 07/02/2019 0425   LEUKOCYTESUR NEGATIVE 07/02/2019  0425   Sepsis Labs Invalid input(s): PROCALCITONIN,  WBC,  LACTICIDVEN Microbiology Recent Results (from the past 240 hour(s))  SARS CORONAVIRUS 2 (TAT 6-24 HRS) Nasopharyngeal Nasopharyngeal Swab     Status: None   Collection Time: 07/02/19  4:47 AM   Specimen: Nasopharyngeal Swab  Result Value Ref Range Status   SARS Coronavirus 2 NEGATIVE NEGATIVE Final    Comment: (NOTE) SARS-CoV-2 target nucleic acids are NOT DETECTED. The SARS-CoV-2 RNA is generally detectable in upper and lower respiratory specimens during the acute phase of infection. Negative results do not preclude SARS-CoV-2 infection, do not rule out co-infections with other pathogens, and should not be used as the sole basis for treatment or other patient management decisions. Negative results must be combined with clinical observations, patient history, and epidemiological information. The expected result is Negative. Fact Sheet for Patients: SugarRoll.be Fact Sheet for Healthcare Providers: https://www.woods-mathews.com/ This test is not yet approved or cleared by the [place] FDA and  has been authorized for detection and/or diagnosis of SARS-CoV-2 by FDA under an Emergency Use Authorization (EUA). This EUA will remain  in effect (meaning this test can be used) for the duration of the COVID-19 declaration under Section 56 4(b)(1) of the Act, 21 U.S.C. section 360bbb-3(b)(1), unless the authorization is terminated or revoked sooner. Performed at [facility], [place] [address], [place], [place] [address]   Culture, blood (routine x 2)     Status: None (Preliminary result)   Collection Time: 07/02/19 12:15 PM   Specimen: BLOOD LEFT HAND  Result Value Ref Range Status   Specimen Description   Final    BLOOD LEFT HAND Performed at [facility] [address], [place], [place] [address]    Special Requests   Final    BOTTLES  DRAWN AEROBIC AND  ANAEROBIC Blood Culture adequate volume Performed at [facility] [address], [place], [place] [address]    Culture   Final    NO GROWTH < 24 HOURS Performed at [facility] [address] [place], [place] [address]    Report Status PENDING  Incomplete  Culture, blood (routine x 2)     Status: None (Preliminary result)   Collection Time: 07/02/19  7:11 PM   Specimen: BLOOD  Result Value Ref Range Status   Specimen Description   Final    BLOOD BLOOD LEFT HAND Performed at [facility] [address], [place], [place] [address]    Special Requests   Final    BOTTLES DRAWN AEROBIC ONLY Blood Culture adequate volume Performed at [facility] [address] [place], [place] [address]    Culture   Final    NO GROWTH < 24 HOURS Performed at [facility] [address] [place], [place] [address]    Report Status PENDING  Incomplete     Time coordinating discharge: 35 minutes       SIGNED:   Edwin Dada, MD  [facility] 07/03/2019, 8:49 PM

## 2019-07-04 LAB — LEGIONELLA PNEUMOPHILA SEROGP 1 UR AG: L. pneumophila Serogp 1 Ur Ag: NEGATIVE

## 2019-07-07 LAB — CULTURE, BLOOD (ROUTINE X 2)
Culture: NO GROWTH
Culture: NO GROWTH
Special Requests: ADEQUATE
Special Requests: ADEQUATE

## 2019-07-12 DIAGNOSIS — I5030 Unspecified diastolic (congestive) heart failure: Secondary | ICD-10-CM | POA: Diagnosis not present

## 2019-07-12 DIAGNOSIS — Z1159 Encounter for screening for other viral diseases: Secondary | ICD-10-CM | POA: Diagnosis not present

## 2019-07-12 DIAGNOSIS — I4891 Unspecified atrial fibrillation: Secondary | ICD-10-CM | POA: Diagnosis not present

## 2019-07-16 DIAGNOSIS — B9620 Unspecified Escherichia coli [E. coli] as the cause of diseases classified elsewhere: Secondary | ICD-10-CM | POA: Diagnosis not present

## 2019-07-16 DIAGNOSIS — Z79899 Other long term (current) drug therapy: Secondary | ICD-10-CM | POA: Diagnosis not present

## 2019-07-16 DIAGNOSIS — F418 Other specified anxiety disorders: Secondary | ICD-10-CM | POA: Diagnosis not present

## 2019-07-16 DIAGNOSIS — Z7982 Long term (current) use of aspirin: Secondary | ICD-10-CM | POA: Diagnosis not present

## 2019-07-16 DIAGNOSIS — I130 Hypertensive heart and chronic kidney disease with heart failure and stage 1 through stage 4 chronic kidney disease, or unspecified chronic kidney disease: Secondary | ICD-10-CM | POA: Diagnosis not present

## 2019-07-16 DIAGNOSIS — B952 Enterococcus as the cause of diseases classified elsewhere: Secondary | ICD-10-CM | POA: Diagnosis not present

## 2019-07-16 DIAGNOSIS — R5381 Other malaise: Secondary | ICD-10-CM | POA: Diagnosis not present

## 2019-07-16 DIAGNOSIS — I5033 Acute on chronic diastolic (congestive) heart failure: Secondary | ICD-10-CM | POA: Diagnosis not present

## 2019-07-16 DIAGNOSIS — S62524D Nondisplaced fracture of distal phalanx of right thumb, subsequent encounter for fracture with routine healing: Secondary | ICD-10-CM | POA: Diagnosis not present

## 2019-07-16 DIAGNOSIS — Z7902 Long term (current) use of antithrombotics/antiplatelets: Secondary | ICD-10-CM | POA: Diagnosis not present

## 2019-07-16 DIAGNOSIS — R2681 Unsteadiness on feet: Secondary | ICD-10-CM | POA: Diagnosis not present

## 2019-07-16 DIAGNOSIS — E1122 Type 2 diabetes mellitus with diabetic chronic kidney disease: Secondary | ICD-10-CM | POA: Diagnosis not present

## 2019-07-16 DIAGNOSIS — J189 Pneumonia, unspecified organism: Secondary | ICD-10-CM | POA: Diagnosis not present

## 2019-07-16 DIAGNOSIS — W19XXXD Unspecified fall, subsequent encounter: Secondary | ICD-10-CM | POA: Diagnosis not present

## 2019-07-16 DIAGNOSIS — Z993 Dependence on wheelchair: Secondary | ICD-10-CM | POA: Diagnosis not present

## 2019-07-16 DIAGNOSIS — D631 Anemia in chronic kidney disease: Secondary | ICD-10-CM | POA: Diagnosis not present

## 2019-07-16 DIAGNOSIS — M1A9XX0 Chronic gout, unspecified, without tophus (tophi): Secondary | ICD-10-CM | POA: Diagnosis not present

## 2019-07-16 DIAGNOSIS — Z794 Long term (current) use of insulin: Secondary | ICD-10-CM | POA: Diagnosis not present

## 2019-07-16 DIAGNOSIS — Z87440 Personal history of urinary (tract) infections: Secondary | ICD-10-CM | POA: Diagnosis not present

## 2019-07-16 DIAGNOSIS — N3000 Acute cystitis without hematuria: Secondary | ICD-10-CM | POA: Diagnosis not present

## 2019-07-16 DIAGNOSIS — R262 Difficulty in walking, not elsewhere classified: Secondary | ICD-10-CM | POA: Diagnosis not present

## 2019-07-16 DIAGNOSIS — I480 Paroxysmal atrial fibrillation: Secondary | ICD-10-CM | POA: Diagnosis not present

## 2019-07-16 DIAGNOSIS — E785 Hyperlipidemia, unspecified: Secondary | ICD-10-CM | POA: Diagnosis not present

## 2019-07-16 DIAGNOSIS — N183 Chronic kidney disease, stage 3 unspecified: Secondary | ICD-10-CM | POA: Diagnosis not present

## 2019-07-16 DIAGNOSIS — J440 Chronic obstructive pulmonary disease with acute lower respiratory infection: Secondary | ICD-10-CM | POA: Diagnosis not present

## 2019-07-16 DIAGNOSIS — J441 Chronic obstructive pulmonary disease with (acute) exacerbation: Secondary | ICD-10-CM | POA: Diagnosis not present

## 2019-07-16 DIAGNOSIS — N179 Acute kidney failure, unspecified: Secondary | ICD-10-CM | POA: Diagnosis not present

## 2019-07-16 DIAGNOSIS — I2510 Atherosclerotic heart disease of native coronary artery without angina pectoris: Secondary | ICD-10-CM | POA: Diagnosis not present

## 2019-07-16 DIAGNOSIS — F0390 Unspecified dementia without behavioral disturbance: Secondary | ICD-10-CM | POA: Diagnosis not present

## 2019-07-16 DIAGNOSIS — E039 Hypothyroidism, unspecified: Secondary | ICD-10-CM | POA: Diagnosis not present

## 2019-07-18 DIAGNOSIS — I130 Hypertensive heart and chronic kidney disease with heart failure and stage 1 through stage 4 chronic kidney disease, or unspecified chronic kidney disease: Secondary | ICD-10-CM | POA: Diagnosis not present

## 2019-07-18 DIAGNOSIS — N3000 Acute cystitis without hematuria: Secondary | ICD-10-CM | POA: Diagnosis not present

## 2019-07-24 DIAGNOSIS — E1122 Type 2 diabetes mellitus with diabetic chronic kidney disease: Secondary | ICD-10-CM | POA: Diagnosis not present

## 2019-07-24 DIAGNOSIS — I5033 Acute on chronic diastolic (congestive) heart failure: Secondary | ICD-10-CM | POA: Diagnosis not present

## 2019-07-24 DIAGNOSIS — I130 Hypertensive heart and chronic kidney disease with heart failure and stage 1 through stage 4 chronic kidney disease, or unspecified chronic kidney disease: Secondary | ICD-10-CM | POA: Diagnosis not present

## 2019-08-14 ENCOUNTER — Other Ambulatory Visit: Payer: Self-pay

## 2019-08-14 MED ORDER — VITAMIN D (ERGOCALCIFEROL) 1.25 MG (50000 UNIT) PO CAPS: 50000.0000 [IU] | ORAL_CAPSULE | ORAL | 0 refills | Status: DC

## 2019-08-15 DIAGNOSIS — J441 Chronic obstructive pulmonary disease with (acute) exacerbation: Secondary | ICD-10-CM | POA: Diagnosis not present

## 2019-08-15 DIAGNOSIS — N183 Chronic kidney disease, stage 3 unspecified: Secondary | ICD-10-CM | POA: Diagnosis not present

## 2019-08-15 DIAGNOSIS — I130 Hypertensive heart and chronic kidney disease with heart failure and stage 1 through stage 4 chronic kidney disease, or unspecified chronic kidney disease: Secondary | ICD-10-CM | POA: Diagnosis not present

## 2019-08-15 DIAGNOSIS — E1122 Type 2 diabetes mellitus with diabetic chronic kidney disease: Secondary | ICD-10-CM | POA: Diagnosis not present

## 2019-08-15 DIAGNOSIS — R262 Difficulty in walking, not elsewhere classified: Secondary | ICD-10-CM | POA: Diagnosis not present

## 2019-08-15 DIAGNOSIS — E785 Hyperlipidemia, unspecified: Secondary | ICD-10-CM | POA: Diagnosis not present

## 2019-08-15 DIAGNOSIS — Z993 Dependence on wheelchair: Secondary | ICD-10-CM | POA: Diagnosis not present

## 2019-08-15 DIAGNOSIS — Z79899 Other long term (current) drug therapy: Secondary | ICD-10-CM | POA: Diagnosis not present

## 2019-08-15 DIAGNOSIS — R2681 Unsteadiness on feet: Secondary | ICD-10-CM | POA: Diagnosis not present

## 2019-08-15 DIAGNOSIS — R5381 Other malaise: Secondary | ICD-10-CM | POA: Diagnosis not present

## 2019-08-15 DIAGNOSIS — Z794 Long term (current) use of insulin: Secondary | ICD-10-CM | POA: Diagnosis not present

## 2019-08-15 DIAGNOSIS — Z7902 Long term (current) use of antithrombotics/antiplatelets: Secondary | ICD-10-CM | POA: Diagnosis not present

## 2019-08-15 DIAGNOSIS — J440 Chronic obstructive pulmonary disease with acute lower respiratory infection: Secondary | ICD-10-CM | POA: Diagnosis not present

## 2019-08-15 DIAGNOSIS — W19XXXD Unspecified fall, subsequent encounter: Secondary | ICD-10-CM | POA: Diagnosis not present

## 2019-08-15 DIAGNOSIS — D631 Anemia in chronic kidney disease: Secondary | ICD-10-CM | POA: Diagnosis not present

## 2019-08-15 DIAGNOSIS — E039 Hypothyroidism, unspecified: Secondary | ICD-10-CM | POA: Diagnosis not present

## 2019-08-15 DIAGNOSIS — I5033 Acute on chronic diastolic (congestive) heart failure: Secondary | ICD-10-CM | POA: Diagnosis not present

## 2019-08-15 DIAGNOSIS — I480 Paroxysmal atrial fibrillation: Secondary | ICD-10-CM | POA: Diagnosis not present

## 2019-08-15 DIAGNOSIS — M1A9XX0 Chronic gout, unspecified, without tophus (tophi): Secondary | ICD-10-CM | POA: Diagnosis not present

## 2019-08-15 DIAGNOSIS — F0390 Unspecified dementia without behavioral disturbance: Secondary | ICD-10-CM | POA: Diagnosis not present

## 2019-08-15 DIAGNOSIS — Z7982 Long term (current) use of aspirin: Secondary | ICD-10-CM | POA: Diagnosis not present

## 2019-08-15 DIAGNOSIS — Z87440 Personal history of urinary (tract) infections: Secondary | ICD-10-CM | POA: Diagnosis not present

## 2019-08-15 DIAGNOSIS — I2510 Atherosclerotic heart disease of native coronary artery without angina pectoris: Secondary | ICD-10-CM | POA: Diagnosis not present

## 2019-08-15 DIAGNOSIS — F418 Other specified anxiety disorders: Secondary | ICD-10-CM | POA: Diagnosis not present

## 2019-08-15 DIAGNOSIS — N179 Acute kidney failure, unspecified: Secondary | ICD-10-CM | POA: Diagnosis not present

## 2019-08-20 ENCOUNTER — Telehealth: Payer: Self-pay | Admitting: Legal Medicine

## 2019-08-20 ENCOUNTER — Other Ambulatory Visit: Payer: Self-pay | Admitting: Legal Medicine

## 2019-08-20 DIAGNOSIS — E1122 Type 2 diabetes mellitus with diabetic chronic kidney disease: Secondary | ICD-10-CM

## 2019-08-20 MED ORDER — JANUVIA 100 MG PO TABS: 100.0000 mg | ORAL_TABLET | Freq: Every day | ORAL | 2 refills | Status: DC

## 2019-08-20 NOTE — Telephone Encounter (Signed)
Dr. Henrene Pastor, Patient called needing a refill on: Januvia And 2 others need to be refill that she doesn't know the name. Please send to [facility], [place] [facility], [facility] [place]

## 2019-08-20 NOTE — Telephone Encounter (Signed)
Have her call when she remembers the other medicines, januvia sent in lp

## 2019-10-02 DIAGNOSIS — D638 Anemia in other chronic diseases classified elsewhere: Secondary | ICD-10-CM | POA: Diagnosis not present

## 2019-10-02 DIAGNOSIS — E118 Type 2 diabetes mellitus with unspecified complications: Secondary | ICD-10-CM | POA: Diagnosis not present

## 2019-10-02 DIAGNOSIS — R5383 Other fatigue: Secondary | ICD-10-CM | POA: Diagnosis not present

## 2019-10-02 DIAGNOSIS — R531 Weakness: Secondary | ICD-10-CM | POA: Diagnosis not present

## 2019-10-02 DIAGNOSIS — R350 Frequency of micturition: Secondary | ICD-10-CM | POA: Diagnosis not present

## 2019-10-02 DIAGNOSIS — Z20828 Contact with and (suspected) exposure to other viral communicable diseases: Secondary | ICD-10-CM | POA: Diagnosis not present

## 2019-10-04 DIAGNOSIS — J9811 Atelectasis: Secondary | ICD-10-CM | POA: Diagnosis not present

## 2019-10-04 DIAGNOSIS — R0989 Other specified symptoms and signs involving the circulatory and respiratory systems: Secondary | ICD-10-CM | POA: Diagnosis not present

## 2019-10-04 DIAGNOSIS — R531 Weakness: Secondary | ICD-10-CM | POA: Diagnosis not present

## 2019-10-04 DIAGNOSIS — I10 Essential (primary) hypertension: Secondary | ICD-10-CM | POA: Diagnosis not present

## 2019-10-04 DIAGNOSIS — I959 Hypotension, unspecified: Secondary | ICD-10-CM | POA: Diagnosis not present

## 2019-10-04 DIAGNOSIS — E860 Dehydration: Secondary | ICD-10-CM | POA: Diagnosis not present

## 2019-10-04 DIAGNOSIS — R002 Palpitations: Secondary | ICD-10-CM | POA: Diagnosis not present

## 2019-10-07 ENCOUNTER — Other Ambulatory Visit: Payer: Self-pay | Admitting: Legal Medicine

## 2019-10-24 ENCOUNTER — Other Ambulatory Visit: Payer: Self-pay

## 2019-10-24 DIAGNOSIS — E1122 Type 2 diabetes mellitus with diabetic chronic kidney disease: Secondary | ICD-10-CM

## 2019-10-24 MED ORDER — "SYRINGE/NEEDLE (DISP) 30G X 1/2"" 1 ML MISC": 1.0000 | Freq: Two times a day (BID) | 3 refills | Status: AC

## 2019-10-26 ENCOUNTER — Other Ambulatory Visit: Payer: Self-pay

## 2019-10-26 MED ORDER — LEVOTHYROXINE SODIUM 125 MCG PO TABS: 125.0000 ug | ORAL_TABLET | Freq: Every day | ORAL | 0 refills | Status: DC

## 2019-10-26 MED ORDER — AMLODIPINE BESYLATE 10 MG PO TABS: 10.0000 mg | ORAL_TABLET | Freq: Every day | ORAL | 0 refills | Status: DC

## 2019-10-31 ENCOUNTER — Other Ambulatory Visit: Payer: Self-pay

## 2019-10-31 MED ORDER — CARVEDILOL 3.125 MG PO TABS: 3.1250 mg | ORAL_TABLET | Freq: Every day | ORAL | 2 refills | Status: AC

## 2019-10-31 NOTE — Progress Notes (Signed)
Made in error

## 2019-11-20 ENCOUNTER — Other Ambulatory Visit: Payer: Self-pay | Admitting: Legal Medicine

## 2019-11-25 ENCOUNTER — Other Ambulatory Visit: Payer: Self-pay | Admitting: Legal Medicine

## 2019-11-26 ENCOUNTER — Other Ambulatory Visit: Payer: Self-pay

## 2019-11-26 MED ORDER — FUROSEMIDE 40 MG PO TABS: 40.0000 mg | ORAL_TABLET | Freq: Every day | ORAL | 2 refills | Status: DC

## 2019-12-04 ENCOUNTER — Other Ambulatory Visit: Payer: Self-pay | Admitting: Legal Medicine

## 2019-12-31 DIAGNOSIS — M109 Gout, unspecified: Secondary | ICD-10-CM | POA: Diagnosis not present

## 2020-01-07 ENCOUNTER — Other Ambulatory Visit: Payer: Self-pay | Admitting: Legal Medicine

## 2020-01-09 ENCOUNTER — Other Ambulatory Visit: Payer: Self-pay | Admitting: Legal Medicine

## 2020-01-29 ENCOUNTER — Other Ambulatory Visit: Payer: Self-pay

## 2020-03-20 ENCOUNTER — Ambulatory Visit (INDEPENDENT_AMBULATORY_CARE_PROVIDER_SITE_OTHER): Payer: PPO | Admitting: Legal Medicine

## 2020-03-20 ENCOUNTER — Encounter: Payer: Self-pay | Admitting: Legal Medicine

## 2020-03-20 VITALS — BP 130/50 | HR 67 | Temp 98.0°F | Resp 18 | Ht 66.0 in | Wt 171.0 lb

## 2020-03-20 DIAGNOSIS — R41841 Cognitive communication deficit: Secondary | ICD-10-CM | POA: Diagnosis not present

## 2020-03-20 DIAGNOSIS — Z8673 Personal history of transient ischemic attack (TIA), and cerebral infarction without residual deficits: Secondary | ICD-10-CM | POA: Diagnosis not present

## 2020-03-20 DIAGNOSIS — E785 Hyperlipidemia, unspecified: Secondary | ICD-10-CM | POA: Diagnosis not present

## 2020-03-20 DIAGNOSIS — M7989 Other specified soft tissue disorders: Secondary | ICD-10-CM | POA: Diagnosis not present

## 2020-03-20 DIAGNOSIS — E1121 Type 2 diabetes mellitus with diabetic nephropathy: Secondary | ICD-10-CM

## 2020-03-20 DIAGNOSIS — J9811 Atelectasis: Secondary | ICD-10-CM | POA: Diagnosis not present

## 2020-03-20 DIAGNOSIS — Z7982 Long term (current) use of aspirin: Secondary | ICD-10-CM | POA: Diagnosis not present

## 2020-03-20 DIAGNOSIS — I5032 Chronic diastolic (congestive) heart failure: Secondary | ICD-10-CM | POA: Diagnosis not present

## 2020-03-20 DIAGNOSIS — I2510 Atherosclerotic heart disease of native coronary artery without angina pectoris: Secondary | ICD-10-CM | POA: Diagnosis not present

## 2020-03-20 DIAGNOSIS — R0600 Dyspnea, unspecified: Secondary | ICD-10-CM | POA: Diagnosis not present

## 2020-03-20 DIAGNOSIS — I340 Nonrheumatic mitral (valve) insufficiency: Secondary | ICD-10-CM | POA: Diagnosis not present

## 2020-03-20 DIAGNOSIS — R609 Edema, unspecified: Secondary | ICD-10-CM | POA: Diagnosis not present

## 2020-03-20 DIAGNOSIS — E119 Type 2 diabetes mellitus without complications: Secondary | ICD-10-CM | POA: Diagnosis not present

## 2020-03-20 DIAGNOSIS — I350 Nonrheumatic aortic (valve) stenosis: Secondary | ICD-10-CM | POA: Diagnosis not present

## 2020-03-20 DIAGNOSIS — R2681 Unsteadiness on feet: Secondary | ICD-10-CM | POA: Diagnosis not present

## 2020-03-20 DIAGNOSIS — R531 Weakness: Secondary | ICD-10-CM | POA: Diagnosis not present

## 2020-03-20 DIAGNOSIS — N189 Chronic kidney disease, unspecified: Secondary | ICD-10-CM | POA: Diagnosis not present

## 2020-03-20 DIAGNOSIS — J449 Chronic obstructive pulmonary disease, unspecified: Secondary | ICD-10-CM | POA: Diagnosis not present

## 2020-03-20 DIAGNOSIS — N390 Urinary tract infection, site not specified: Secondary | ICD-10-CM | POA: Diagnosis not present

## 2020-03-20 DIAGNOSIS — F329 Major depressive disorder, single episode, unspecified: Secondary | ICD-10-CM | POA: Diagnosis not present

## 2020-03-20 DIAGNOSIS — M79604 Pain in right leg: Secondary | ICD-10-CM | POA: Diagnosis not present

## 2020-03-20 DIAGNOSIS — I5033 Acute on chronic diastolic (congestive) heart failure: Secondary | ICD-10-CM

## 2020-03-20 DIAGNOSIS — I10 Essential (primary) hypertension: Secondary | ICD-10-CM | POA: Diagnosis not present

## 2020-03-20 DIAGNOSIS — M109 Gout, unspecified: Secondary | ICD-10-CM | POA: Diagnosis not present

## 2020-03-20 DIAGNOSIS — M6281 Muscle weakness (generalized): Secondary | ICD-10-CM | POA: Diagnosis not present

## 2020-03-20 DIAGNOSIS — R600 Localized edema: Secondary | ICD-10-CM | POA: Diagnosis not present

## 2020-03-20 DIAGNOSIS — E1122 Type 2 diabetes mellitus with diabetic chronic kidney disease: Secondary | ICD-10-CM | POA: Diagnosis not present

## 2020-03-20 DIAGNOSIS — Z7401 Bed confinement status: Secondary | ICD-10-CM | POA: Diagnosis not present

## 2020-03-20 DIAGNOSIS — I361 Nonrheumatic tricuspid (valve) insufficiency: Secondary | ICD-10-CM | POA: Diagnosis not present

## 2020-03-20 DIAGNOSIS — I5021 Acute systolic (congestive) heart failure: Secondary | ICD-10-CM | POA: Diagnosis not present

## 2020-03-20 DIAGNOSIS — Z7902 Long term (current) use of antithrombotics/antiplatelets: Secondary | ICD-10-CM | POA: Diagnosis not present

## 2020-03-20 DIAGNOSIS — J811 Chronic pulmonary edema: Secondary | ICD-10-CM | POA: Diagnosis not present

## 2020-03-20 DIAGNOSIS — N170 Acute kidney failure with tubular necrosis: Secondary | ICD-10-CM | POA: Diagnosis not present

## 2020-03-20 DIAGNOSIS — Z794 Long term (current) use of insulin: Secondary | ICD-10-CM | POA: Diagnosis not present

## 2020-03-20 DIAGNOSIS — M2550 Pain in unspecified joint: Secondary | ICD-10-CM | POA: Diagnosis not present

## 2020-03-20 DIAGNOSIS — E1142 Type 2 diabetes mellitus with diabetic polyneuropathy: Secondary | ICD-10-CM | POA: Insufficient documentation

## 2020-03-20 DIAGNOSIS — I130 Hypertensive heart and chronic kidney disease with heart failure and stage 1 through stage 4 chronic kidney disease, or unspecified chronic kidney disease: Secondary | ICD-10-CM | POA: Diagnosis not present

## 2020-03-20 DIAGNOSIS — D638 Anemia in other chronic diseases classified elsewhere: Secondary | ICD-10-CM | POA: Diagnosis not present

## 2020-03-20 DIAGNOSIS — Z79899 Other long term (current) drug therapy: Secondary | ICD-10-CM | POA: Diagnosis not present

## 2020-03-20 DIAGNOSIS — Z9049 Acquired absence of other specified parts of digestive tract: Secondary | ICD-10-CM | POA: Diagnosis not present

## 2020-03-20 DIAGNOSIS — E039 Hypothyroidism, unspecified: Secondary | ICD-10-CM | POA: Diagnosis not present

## 2020-03-20 DIAGNOSIS — R0902 Hypoxemia: Secondary | ICD-10-CM | POA: Diagnosis not present

## 2020-03-20 DIAGNOSIS — N179 Acute kidney failure, unspecified: Secondary | ICD-10-CM | POA: Diagnosis not present

## 2020-03-20 DIAGNOSIS — J90 Pleural effusion, not elsewhere classified: Secondary | ICD-10-CM | POA: Diagnosis not present

## 2020-03-20 DIAGNOSIS — Z23 Encounter for immunization: Secondary | ICD-10-CM | POA: Diagnosis not present

## 2020-03-20 DIAGNOSIS — F0390 Unspecified dementia without behavioral disturbance: Secondary | ICD-10-CM | POA: Diagnosis not present

## 2020-03-20 NOTE — Progress Notes (Signed)
Acute Office Visit  Subjective:    Patient ID: Connie Cooley, female    DOB: [birth year], 79 y.o.   MRN: [id number]  Chief Complaint  Patient presents with  . Fatigue    Since 2 days ago    HPI Patient is in today for severe fatigue starting 03/18/2020 suddenly.  She is not having chest pain but her legs are swelling more.  Can walk with walker  She can walk.  Positive orthopnea and PND.has to sleep on couch. She has history of diastolic failure. She called emts but refused to go to hospital due to wait times. She is noting tachycaria , she has chronic atrial fibrillation She was exposed to Covid 4 days ago.  She told this only as ambulance was carring her out.  Past Medical History:  Diagnosis Date  . CKD (chronic kidney disease), stage III   . Hyperlipidemia   . Obesity   . Thyroid disease     Past Surgical History:  Procedure Laterality Date  . CHOLECYSTECTOMY  2000  . PARTIAL HYSTERECTOMY  1977    Family History  Problem Relation Age of Onset  . Leukemia Brother     Social History   Socioeconomic History  . Marital status: Divorced    Spouse name: Not on file  . Number of children: Not on file  . Years of education: Not on file  . Highest education level: Not on file  Occupational History  . Occupation: Retired  Tobacco Use  . Smoking status: Former Smoker    Types: Cigarettes    Quit date: 1990    Years since quitting: 31.7  . Smokeless tobacco: Never Used  . Tobacco comment: Quit >20 years ago  Vaping Use  . Vaping Use: Former  Substance and Sexual Activity  . Alcohol use: No  . Drug use: No  . Sexual activity: Not Currently  Other Topics Concern  . Not on file  Social History Narrative   Follows special diet   Social Determinants of Health   Financial Resource Strain:   . Difficulty of Paying Living Expenses: Not on file  Food Insecurity:   . Worried About Charity fundraiser in the Last Year: Not on file  . Ran Out of Food in the Last  Year: Not on file  Transportation Needs:   . Lack of Transportation (Medical): Not on file  . Lack of Transportation (Non-Medical): Not on file  Physical Activity:   . Days of Exercise per Week: Not on file  . Minutes of Exercise per Session: Not on file  Stress:   . Feeling of Stress : Not on file  Social Connections:   . Frequency of Communication with Friends and Family: Not on file  . Frequency of Social Gatherings with Friends and Family: Not on file  . Attends Religious Services: Not on file  . Active Member of Clubs or Organizations: Not on file  . Attends Archivist Meetings: Not on file  . Marital Status: Not on file  Intimate Partner Violence:   . Fear of Current or Ex-Partner: Not on file  . Emotionally Abused: Not on file  . Physically Abused: Not on file  . Sexually Abused: Not on file    Outpatient Medications Prior to Visit  Medication Sig Dispense Refill  . allopurinol (ZYLOPRIM) 100 MG tablet TAKE 1 TABLET BY MOUTH ONCE DAILY 90 tablet 2  . amLODipine (NORVASC) 10 MG tablet TAKE 1 TABLET(10 MG) BY MOUTH  DAILY 90 tablet 1  . BD INSULIN SYRINGE U/F 31G X 5/16" 1 ML MISC 2 (two) times daily.    . carvedilol (COREG) 3.125 MG tablet Take 1 tablet (3.125 mg total) by mouth daily. 90 tablet 2  . ELIQUIS 2.5 MG TABS tablet TAKE 1 TABLET BY MOUTH TWICE DAILY 60 tablet 2  . furosemide (LASIX) 40 MG tablet Take 1 tablet (40 mg total) by mouth daily. 90 tablet 2  . hydrALAZINE (APRESOLINE) 100 MG tablet TAKE 1 TABLET BY MOUTH THREE TIMES DAILY 270 tablet 2  . insulin NPH-insulin regular (NOVOLIN 70/30) (70-30) 100 UNIT/ML injection Inject 3-7 Units into the skin 2 (two) times daily with a meal. Sliding scale Take 7 units in the morning and 3 units in the evening    . irbesartan (AVAPRO) 300 MG tablet TAKE 1 TABLET ONCE DAILY. 90 tablet 2  . isosorbide mononitrate (IMDUR) 60 MG 24 hr tablet TAKE 1 TABLET BY MOUTH EVERY MORNING 90 tablet 2  . JANUVIA 100 MG tablet  Take 1 tablet (100 mg total) by mouth daily. 90 tablet 2  . levothyroxine (SYNTHROID) 125 MCG tablet TAKE 1 TABLET(125 MCG) BY MOUTH DAILY 90 tablet 2  . Melatonin 3 MG TABS Take 3 mg by mouth at bedtime.    . metoprolol succinate (TOPROL-XL) 25 MG 24 hr tablet Take 25 mg by mouth daily.    . pravastatin (PRAVACHOL) 40 MG tablet TAKE 1 TABLET BY MOUTH ONCE DAILY 90 tablet 2  . Probiotic Product (PROBIOTIC DAILY PO) Take 1 tablet by mouth daily.    . Syringe/Needle, Disp, 30G X 1/2" 1 ML MISC 1 each by Does not apply route 2 (two) times daily. 50 each 3  . vitamin B-12 (CYANOCOBALAMIN) 1000 MCG tablet Take 1,000 mcg by mouth daily.    . Vitamin D, Ergocalciferol, (DRISDOL) 1.25 MG (50000 UNIT) CAPS capsule Take 1 capsule (50,000 Units total) by mouth every 7 (seven) days. 12 capsule 2   No facility-administered medications prior to visit.    Allergies  Allergen Reactions  . Metformin And Related Diarrhea    Review of Systems  Constitutional: Negative.   HENT: Negative.   Eyes: Negative.   Respiratory: Positive for shortness of breath.   Cardiovascular: Positive for palpitations and leg swelling.  Gastrointestinal: Negative.  Blood in stool: ekg.  Endocrine: Negative.   Genitourinary: Negative.   Musculoskeletal: Negative.   Skin: Negative.   Neurological: Negative.   Psychiatric/Behavioral: Negative.        Objective:    Physical Exam Vitals reviewed.  Constitutional:      Appearance: Normal appearance.     Comments: In wheel chair  HENT:     Right Ear: Tympanic membrane normal.     Left Ear: Tympanic membrane normal.     Mouth/Throat:     Mouth: Mucous membranes are moist.     Pharynx: Oropharynx is clear.  Eyes:     Extraocular Movements: Extraocular movements intact.     Conjunctiva/sclera: Conjunctivae normal.     Pupils: Pupils are equal, round, and reactive to light.  Cardiovascular:     Rate and Rhythm: Normal rate. Rhythm irregular.     Pulses: Normal  pulses.     Heart sounds: Murmur (holosystolic) heard.   Pulmonary:     Breath sounds: Rales (at bases) present.  Abdominal:     General: Abdomen is flat. Bowel sounds are normal.  Musculoskeletal:     Cervical back: Normal range of motion and  neck supple.  Skin:    General: Skin is warm.     Capillary Refill: Capillary refill takes less than 2 seconds.  Neurological:     General: No focal deficit present.     Mental Status: She is alert and oriented to person, place, and time.   EKG inferior t depression and Q's in anterior leads.  BP (!) 130/50 (BP Location: Right Arm, Patient Position: Sitting)   Pulse 67   Temp 98 F (36.7 C)   Resp 18   Ht 5\' 6"  (1.676 m)   Wt 171 lb (77.6 kg)   SpO2 92%   BMI 27.60 kg/m  Wt Readings from Last 3 Encounters:  03/20/20 171 lb (77.6 kg)  07/02/19 178 lb (80.7 kg)    Health Maintenance Due  Topic Date Due  . Hepatitis C Screening  Never done  . FOOT EXAM  Never done  . OPHTHALMOLOGY EXAM  Never done  . COVID-19 Vaccine (1) Never done  . TETANUS/TDAP  Never done  . DEXA SCAN  Never done  . PNA vac Low Risk Adult (1 of 2 - PCV13) Never done  . HEMOGLOBIN A1C  07/01/2008  . INFLUENZA VACCINE  02/03/2020    There are no preventive care reminders to display for this patient.   Lab Results  Component Value Date   TSH 2.380 07/02/2019   Lab Results  Component Value Date   WBC 9.2 07/03/2019   HGB 10.9 (L) 07/03/2019   HCT 33.9 (L) 07/03/2019   MCV 86.7 07/03/2019   PLT 141 (L) 07/03/2019   Lab Results  Component Value Date   NA 136 07/03/2019   K 3.5 07/03/2019   CO2 19 (L) 07/03/2019   GLUCOSE 253 (H) 07/03/2019   BUN 35 (H) 07/03/2019   CREATININE 2.60 (H) 07/03/2019   BILITOT 0.6 07/02/2019   ALKPHOS 57 07/02/2019   AST 12 (L) 07/02/2019   ALT 12 07/02/2019   PROT 6.4 (L) 07/02/2019   ALBUMIN 2.9 (L) 07/02/2019   CALCIUM 8.8 (L) 07/03/2019   ANIONGAP 9 07/03/2019   Lab Results  Component Value Date   CHOL  (H) 12/31/2007    201        ATP III CLASSIFICATION:  <200     mg/dL   Desirable  200-239  mg/dL   Borderline High  >=240    mg/dL   High   Lab Results  Component Value Date   HDL 29 (L) 12/31/2007   Lab Results  Component Value Date   LDLCALC (H) 12/31/2007    107        Total Cholesterol/HDL:CHD Risk Coronary Heart Disease Risk Table                     Men   Women  1/2 Average Risk   3.4   3.3   Lab Results  Component Value Date   TRIG 326 (H) 12/31/2007   Lab Results  Component Value Date   CHOLHDL 6.9 12/31/2007   Lab Results  Component Value Date   HGBA1C (H) 12/31/2007    8.6 (NOTE)   The ADA recommends the following therapeutic goal for glycemic   control related to Hgb A1C measurement:   Goal of Therapy:   < 7.0% Hgb A1C   Reference: American Diabetes Association: Clinical Practice   Recommendations 2008, Diabetes Care,  2008, 31:(Suppl 1).       Assessment & Plan:  1. Acute on chronic diastolic  CHF (congestive heart failure) (H[facility]) - EKG 12-Lead Patient is in acute diastolic heart failure with increased pedal edema, orthopnea and PND. minimal chest pain Plan transfer to hospital 2. Diabetic polyneuropathy associated with type 2 diabetes mellitus ([place]) .Patient is on medicines  3. Diabetic glomerulopathy ([place]) Patient on medicines but not checked in 6 months    No orders of the defined types were placed in this encounter.   Orders Placed This Encounter  Procedures  . EKG 12-Lead     Follow-up: No follow-ups on file.  An After Visit Summary was printed and given to the patient.  [place] [phone]

## 2020-03-21 DIAGNOSIS — R600 Localized edema: Secondary | ICD-10-CM | POA: Diagnosis not present

## 2020-03-21 DIAGNOSIS — I5033 Acute on chronic diastolic (congestive) heart failure: Secondary | ICD-10-CM | POA: Diagnosis not present

## 2020-03-21 DIAGNOSIS — R0600 Dyspnea, unspecified: Secondary | ICD-10-CM | POA: Diagnosis not present

## 2020-03-22 DIAGNOSIS — I5033 Acute on chronic diastolic (congestive) heart failure: Secondary | ICD-10-CM | POA: Diagnosis not present

## 2020-03-22 DIAGNOSIS — R0600 Dyspnea, unspecified: Secondary | ICD-10-CM | POA: Diagnosis not present

## 2020-03-22 DIAGNOSIS — R600 Localized edema: Secondary | ICD-10-CM | POA: Diagnosis not present

## 2020-03-23 DIAGNOSIS — I5033 Acute on chronic diastolic (congestive) heart failure: Secondary | ICD-10-CM | POA: Diagnosis not present

## 2020-03-23 DIAGNOSIS — R600 Localized edema: Secondary | ICD-10-CM | POA: Diagnosis not present

## 2020-03-23 DIAGNOSIS — R0600 Dyspnea, unspecified: Secondary | ICD-10-CM | POA: Diagnosis not present

## 2020-03-24 DIAGNOSIS — J449 Chronic obstructive pulmonary disease, unspecified: Secondary | ICD-10-CM | POA: Diagnosis not present

## 2020-03-24 DIAGNOSIS — R600 Localized edema: Secondary | ICD-10-CM | POA: Diagnosis not present

## 2020-03-24 DIAGNOSIS — E46 Unspecified protein-calorie malnutrition: Secondary | ICD-10-CM | POA: Diagnosis not present

## 2020-03-24 DIAGNOSIS — F329 Major depressive disorder, single episode, unspecified: Secondary | ICD-10-CM | POA: Diagnosis not present

## 2020-03-24 DIAGNOSIS — Z9049 Acquired absence of other specified parts of digestive tract: Secondary | ICD-10-CM | POA: Diagnosis not present

## 2020-03-24 DIAGNOSIS — M109 Gout, unspecified: Secondary | ICD-10-CM | POA: Diagnosis not present

## 2020-03-24 DIAGNOSIS — R41841 Cognitive communication deficit: Secondary | ICD-10-CM | POA: Diagnosis not present

## 2020-03-24 DIAGNOSIS — I5033 Acute on chronic diastolic (congestive) heart failure: Secondary | ICD-10-CM | POA: Diagnosis not present

## 2020-03-24 DIAGNOSIS — I5032 Chronic diastolic (congestive) heart failure: Secondary | ICD-10-CM | POA: Diagnosis not present

## 2020-03-24 DIAGNOSIS — E1142 Type 2 diabetes mellitus with diabetic polyneuropathy: Secondary | ICD-10-CM | POA: Diagnosis not present

## 2020-03-24 DIAGNOSIS — M6281 Muscle weakness (generalized): Secondary | ICD-10-CM | POA: Diagnosis not present

## 2020-03-24 DIAGNOSIS — N390 Urinary tract infection, site not specified: Secondary | ICD-10-CM | POA: Diagnosis not present

## 2020-03-24 DIAGNOSIS — R0902 Hypoxemia: Secondary | ICD-10-CM | POA: Diagnosis not present

## 2020-03-24 DIAGNOSIS — D638 Anemia in other chronic diseases classified elsewhere: Secondary | ICD-10-CM | POA: Diagnosis not present

## 2020-03-24 DIAGNOSIS — Z7982 Long term (current) use of aspirin: Secondary | ICD-10-CM | POA: Diagnosis not present

## 2020-03-24 DIAGNOSIS — M2550 Pain in unspecified joint: Secondary | ICD-10-CM | POA: Diagnosis not present

## 2020-03-24 DIAGNOSIS — D649 Anemia, unspecified: Secondary | ICD-10-CM | POA: Diagnosis not present

## 2020-03-24 DIAGNOSIS — Z7902 Long term (current) use of antithrombotics/antiplatelets: Secondary | ICD-10-CM | POA: Diagnosis not present

## 2020-03-24 DIAGNOSIS — Z8673 Personal history of transient ischemic attack (TIA), and cerebral infarction without residual deficits: Secondary | ICD-10-CM | POA: Diagnosis not present

## 2020-03-24 DIAGNOSIS — I2510 Atherosclerotic heart disease of native coronary artery without angina pectoris: Secondary | ICD-10-CM | POA: Diagnosis not present

## 2020-03-24 DIAGNOSIS — E1122 Type 2 diabetes mellitus with diabetic chronic kidney disease: Secondary | ICD-10-CM | POA: Diagnosis not present

## 2020-03-24 DIAGNOSIS — I5021 Acute systolic (congestive) heart failure: Secondary | ICD-10-CM | POA: Diagnosis not present

## 2020-03-24 DIAGNOSIS — E039 Hypothyroidism, unspecified: Secondary | ICD-10-CM | POA: Diagnosis not present

## 2020-03-24 DIAGNOSIS — N179 Acute kidney failure, unspecified: Secondary | ICD-10-CM | POA: Diagnosis not present

## 2020-03-24 DIAGNOSIS — Z23 Encounter for immunization: Secondary | ICD-10-CM | POA: Diagnosis not present

## 2020-03-24 DIAGNOSIS — I10 Essential (primary) hypertension: Secondary | ICD-10-CM | POA: Diagnosis not present

## 2020-03-24 DIAGNOSIS — Z7401 Bed confinement status: Secondary | ICD-10-CM | POA: Diagnosis not present

## 2020-03-24 DIAGNOSIS — E785 Hyperlipidemia, unspecified: Secondary | ICD-10-CM | POA: Diagnosis not present

## 2020-03-24 DIAGNOSIS — N170 Acute kidney failure with tubular necrosis: Secondary | ICD-10-CM | POA: Diagnosis not present

## 2020-03-24 DIAGNOSIS — F0390 Unspecified dementia without behavioral disturbance: Secondary | ICD-10-CM | POA: Diagnosis not present

## 2020-03-24 DIAGNOSIS — R0600 Dyspnea, unspecified: Secondary | ICD-10-CM | POA: Diagnosis not present

## 2020-03-24 DIAGNOSIS — E119 Type 2 diabetes mellitus without complications: Secondary | ICD-10-CM | POA: Diagnosis not present

## 2020-03-24 DIAGNOSIS — E11649 Type 2 diabetes mellitus with hypoglycemia without coma: Secondary | ICD-10-CM | POA: Diagnosis not present

## 2020-03-24 DIAGNOSIS — N189 Chronic kidney disease, unspecified: Secondary | ICD-10-CM | POA: Diagnosis not present

## 2020-03-24 DIAGNOSIS — Z79899 Other long term (current) drug therapy: Secondary | ICD-10-CM | POA: Diagnosis not present

## 2020-03-24 DIAGNOSIS — Z794 Long term (current) use of insulin: Secondary | ICD-10-CM | POA: Diagnosis not present

## 2020-03-24 DIAGNOSIS — R2681 Unsteadiness on feet: Secondary | ICD-10-CM | POA: Diagnosis not present

## 2020-03-24 DIAGNOSIS — I130 Hypertensive heart and chronic kidney disease with heart failure and stage 1 through stage 4 chronic kidney disease, or unspecified chronic kidney disease: Secondary | ICD-10-CM | POA: Diagnosis not present

## 2020-03-26 ENCOUNTER — Other Ambulatory Visit: Payer: Self-pay | Admitting: Legal Medicine

## 2020-03-26 DIAGNOSIS — D649 Anemia, unspecified: Secondary | ICD-10-CM | POA: Diagnosis not present

## 2020-03-26 DIAGNOSIS — I5032 Chronic diastolic (congestive) heart failure: Secondary | ICD-10-CM | POA: Diagnosis not present

## 2020-03-26 DIAGNOSIS — E11649 Type 2 diabetes mellitus with hypoglycemia without coma: Secondary | ICD-10-CM | POA: Diagnosis not present

## 2020-03-26 DIAGNOSIS — E46 Unspecified protein-calorie malnutrition: Secondary | ICD-10-CM | POA: Diagnosis not present

## 2020-04-01 DIAGNOSIS — I5032 Chronic diastolic (congestive) heart failure: Secondary | ICD-10-CM | POA: Diagnosis not present

## 2020-04-01 DIAGNOSIS — E039 Hypothyroidism, unspecified: Secondary | ICD-10-CM | POA: Diagnosis not present

## 2020-04-01 DIAGNOSIS — M6281 Muscle weakness (generalized): Secondary | ICD-10-CM | POA: Diagnosis not present

## 2020-04-01 DIAGNOSIS — R41841 Cognitive communication deficit: Secondary | ICD-10-CM | POA: Diagnosis not present

## 2020-04-15 ENCOUNTER — Telehealth: Payer: Self-pay

## 2020-04-15 NOTE — Telephone Encounter (Signed)
LM to schedule the pt an AWV with Dr. Henrene Pastor.

## 2020-04-16 ENCOUNTER — Other Ambulatory Visit: Payer: Self-pay | Admitting: Legal Medicine

## 2020-04-18 DIAGNOSIS — E11649 Type 2 diabetes mellitus with hypoglycemia without coma: Secondary | ICD-10-CM | POA: Diagnosis not present

## 2020-04-18 DIAGNOSIS — E46 Unspecified protein-calorie malnutrition: Secondary | ICD-10-CM | POA: Diagnosis not present

## 2020-04-18 DIAGNOSIS — D649 Anemia, unspecified: Secondary | ICD-10-CM | POA: Diagnosis not present

## 2020-04-18 DIAGNOSIS — I5032 Chronic diastolic (congestive) heart failure: Secondary | ICD-10-CM | POA: Diagnosis not present

## 2020-04-22 ENCOUNTER — Telehealth: Payer: Self-pay

## 2020-04-22 DIAGNOSIS — E46 Unspecified protein-calorie malnutrition: Secondary | ICD-10-CM | POA: Diagnosis not present

## 2020-04-22 DIAGNOSIS — I5032 Chronic diastolic (congestive) heart failure: Secondary | ICD-10-CM | POA: Diagnosis not present

## 2020-04-22 DIAGNOSIS — Z794 Long term (current) use of insulin: Secondary | ICD-10-CM | POA: Diagnosis not present

## 2020-04-22 DIAGNOSIS — I110 Hypertensive heart disease with heart failure: Secondary | ICD-10-CM | POA: Diagnosis not present

## 2020-04-22 DIAGNOSIS — E039 Hypothyroidism, unspecified: Secondary | ICD-10-CM | POA: Diagnosis not present

## 2020-04-22 DIAGNOSIS — E1142 Type 2 diabetes mellitus with diabetic polyneuropathy: Secondary | ICD-10-CM | POA: Diagnosis not present

## 2020-04-22 DIAGNOSIS — Z9181 History of falling: Secondary | ICD-10-CM | POA: Diagnosis not present

## 2020-04-22 DIAGNOSIS — M6281 Muscle weakness (generalized): Secondary | ICD-10-CM | POA: Diagnosis not present

## 2020-04-22 DIAGNOSIS — R41841 Cognitive communication deficit: Secondary | ICD-10-CM | POA: Diagnosis not present

## 2020-04-22 DIAGNOSIS — J449 Chronic obstructive pulmonary disease, unspecified: Secondary | ICD-10-CM | POA: Diagnosis not present

## 2020-04-22 DIAGNOSIS — D649 Anemia, unspecified: Secondary | ICD-10-CM | POA: Diagnosis not present

## 2020-04-22 NOTE — Telephone Encounter (Signed)
LM requesting for pt to call back to schedule for AWV. Last done on 10/05/2018.

## 2020-05-03 DIAGNOSIS — J441 Chronic obstructive pulmonary disease with (acute) exacerbation: Secondary | ICD-10-CM | POA: Diagnosis not present

## 2020-05-03 DIAGNOSIS — R069 Unspecified abnormalities of breathing: Secondary | ICD-10-CM | POA: Diagnosis not present

## 2020-05-03 DIAGNOSIS — I517 Cardiomegaly: Secondary | ICD-10-CM | POA: Diagnosis not present

## 2020-05-03 DIAGNOSIS — I119 Hypertensive heart disease without heart failure: Secondary | ICD-10-CM | POA: Diagnosis not present

## 2020-05-03 DIAGNOSIS — I350 Nonrheumatic aortic (valve) stenosis: Secondary | ICD-10-CM | POA: Diagnosis not present

## 2020-05-03 DIAGNOSIS — N3000 Acute cystitis without hematuria: Secondary | ICD-10-CM | POA: Diagnosis not present

## 2020-05-03 DIAGNOSIS — E039 Hypothyroidism, unspecified: Secondary | ICD-10-CM | POA: Diagnosis not present

## 2020-05-03 DIAGNOSIS — R0902 Hypoxemia: Secondary | ICD-10-CM | POA: Diagnosis not present

## 2020-05-03 DIAGNOSIS — N2889 Other specified disorders of kidney and ureter: Secondary | ICD-10-CM | POA: Diagnosis not present

## 2020-05-03 DIAGNOSIS — E860 Dehydration: Secondary | ICD-10-CM | POA: Diagnosis not present

## 2020-05-03 DIAGNOSIS — I10 Essential (primary) hypertension: Secondary | ICD-10-CM | POA: Diagnosis not present

## 2020-05-03 DIAGNOSIS — I5030 Unspecified diastolic (congestive) heart failure: Secondary | ICD-10-CM | POA: Diagnosis not present

## 2020-05-03 DIAGNOSIS — I509 Heart failure, unspecified: Secondary | ICD-10-CM | POA: Diagnosis not present

## 2020-05-03 DIAGNOSIS — J9601 Acute respiratory failure with hypoxia: Secondary | ICD-10-CM | POA: Diagnosis not present

## 2020-05-03 DIAGNOSIS — I132 Hypertensive heart and chronic kidney disease with heart failure and with stage 5 chronic kidney disease, or end stage renal disease: Secondary | ICD-10-CM | POA: Diagnosis not present

## 2020-05-03 DIAGNOSIS — I272 Pulmonary hypertension, unspecified: Secondary | ICD-10-CM | POA: Diagnosis not present

## 2020-05-03 DIAGNOSIS — I2510 Atherosclerotic heart disease of native coronary artery without angina pectoris: Secondary | ICD-10-CM | POA: Diagnosis not present

## 2020-05-03 DIAGNOSIS — J440 Chronic obstructive pulmonary disease with acute lower respiratory infection: Secondary | ICD-10-CM | POA: Diagnosis not present

## 2020-05-03 DIAGNOSIS — N281 Cyst of kidney, acquired: Secondary | ICD-10-CM | POA: Diagnosis not present

## 2020-05-03 DIAGNOSIS — J811 Chronic pulmonary edema: Secondary | ICD-10-CM | POA: Diagnosis not present

## 2020-05-03 DIAGNOSIS — Z79899 Other long term (current) drug therapy: Secondary | ICD-10-CM | POA: Diagnosis not present

## 2020-05-03 DIAGNOSIS — F0390 Unspecified dementia without behavioral disturbance: Secondary | ICD-10-CM | POA: Diagnosis not present

## 2020-05-03 DIAGNOSIS — R531 Weakness: Secondary | ICD-10-CM | POA: Diagnosis not present

## 2020-05-03 DIAGNOSIS — E11649 Type 2 diabetes mellitus with hypoglycemia without coma: Secondary | ICD-10-CM | POA: Diagnosis not present

## 2020-05-03 DIAGNOSIS — I480 Paroxysmal atrial fibrillation: Secondary | ICD-10-CM | POA: Diagnosis not present

## 2020-05-03 DIAGNOSIS — I110 Hypertensive heart disease with heart failure: Secondary | ICD-10-CM | POA: Diagnosis not present

## 2020-05-03 DIAGNOSIS — N179 Acute kidney failure, unspecified: Secondary | ICD-10-CM | POA: Diagnosis not present

## 2020-05-03 DIAGNOSIS — J9811 Atelectasis: Secondary | ICD-10-CM | POA: Diagnosis not present

## 2020-05-03 DIAGNOSIS — Z794 Long term (current) use of insulin: Secondary | ICD-10-CM | POA: Diagnosis not present

## 2020-05-03 DIAGNOSIS — R778 Other specified abnormalities of plasma proteins: Secondary | ICD-10-CM | POA: Diagnosis not present

## 2020-05-03 DIAGNOSIS — I700 Atherosclerosis of aorta: Secondary | ICD-10-CM | POA: Diagnosis not present

## 2020-05-03 DIAGNOSIS — R600 Localized edema: Secondary | ICD-10-CM | POA: Diagnosis not present

## 2020-05-03 DIAGNOSIS — J90 Pleural effusion, not elsewhere classified: Secondary | ICD-10-CM | POA: Diagnosis not present

## 2020-05-03 DIAGNOSIS — M7989 Other specified soft tissue disorders: Secondary | ICD-10-CM | POA: Diagnosis not present

## 2020-05-03 DIAGNOSIS — I214 Non-ST elevation (NSTEMI) myocardial infarction: Secondary | ICD-10-CM | POA: Diagnosis not present

## 2020-05-03 DIAGNOSIS — N184 Chronic kidney disease, stage 4 (severe): Secondary | ICD-10-CM | POA: Diagnosis not present

## 2020-05-03 DIAGNOSIS — E162 Hypoglycemia, unspecified: Secondary | ICD-10-CM | POA: Diagnosis not present

## 2020-05-03 DIAGNOSIS — R439 Unspecified disturbances of smell and taste: Secondary | ICD-10-CM | POA: Diagnosis not present

## 2020-05-03 DIAGNOSIS — J189 Pneumonia, unspecified organism: Secondary | ICD-10-CM | POA: Diagnosis not present

## 2020-05-03 DIAGNOSIS — Z87891 Personal history of nicotine dependence: Secondary | ICD-10-CM | POA: Diagnosis not present

## 2020-05-03 DIAGNOSIS — R0602 Shortness of breath: Secondary | ICD-10-CM | POA: Diagnosis not present

## 2020-05-03 DIAGNOSIS — I5032 Chronic diastolic (congestive) heart failure: Secondary | ICD-10-CM | POA: Diagnosis not present

## 2020-05-03 DIAGNOSIS — M159 Polyosteoarthritis, unspecified: Secondary | ICD-10-CM | POA: Diagnosis not present

## 2020-05-03 DIAGNOSIS — Z8673 Personal history of transient ischemic attack (TIA), and cerebral infarction without residual deficits: Secondary | ICD-10-CM | POA: Diagnosis not present

## 2020-05-03 DIAGNOSIS — I5033 Acute on chronic diastolic (congestive) heart failure: Secondary | ICD-10-CM | POA: Diagnosis not present

## 2020-05-03 DIAGNOSIS — Z9981 Dependence on supplemental oxygen: Secondary | ICD-10-CM | POA: Diagnosis not present

## 2020-05-03 DIAGNOSIS — D638 Anemia in other chronic diseases classified elsewhere: Secondary | ICD-10-CM | POA: Diagnosis not present

## 2020-05-03 DIAGNOSIS — E1122 Type 2 diabetes mellitus with diabetic chronic kidney disease: Secondary | ICD-10-CM | POA: Diagnosis not present

## 2020-05-03 DIAGNOSIS — E161 Other hypoglycemia: Secondary | ICD-10-CM | POA: Diagnosis not present

## 2020-05-03 DIAGNOSIS — N185 Chronic kidney disease, stage 5: Secondary | ICD-10-CM | POA: Diagnosis not present

## 2020-05-03 DIAGNOSIS — J9690 Respiratory failure, unspecified, unspecified whether with hypoxia or hypercapnia: Secondary | ICD-10-CM | POA: Diagnosis not present

## 2020-05-04 ENCOUNTER — Other Ambulatory Visit: Payer: Self-pay | Admitting: Legal Medicine

## 2020-05-04 DIAGNOSIS — I5032 Chronic diastolic (congestive) heart failure: Secondary | ICD-10-CM | POA: Diagnosis not present

## 2020-05-04 DIAGNOSIS — N184 Chronic kidney disease, stage 4 (severe): Secondary | ICD-10-CM | POA: Diagnosis not present

## 2020-05-04 DIAGNOSIS — J90 Pleural effusion, not elsewhere classified: Secondary | ICD-10-CM

## 2020-05-04 DIAGNOSIS — N1832 Chronic kidney disease, stage 3b: Secondary | ICD-10-CM

## 2020-05-04 DIAGNOSIS — N179 Acute kidney failure, unspecified: Secondary | ICD-10-CM

## 2020-05-04 DIAGNOSIS — I480 Paroxysmal atrial fibrillation: Secondary | ICD-10-CM

## 2020-05-04 DIAGNOSIS — I214 Non-ST elevation (NSTEMI) myocardial infarction: Secondary | ICD-10-CM

## 2020-05-05 ENCOUNTER — Other Ambulatory Visit: Payer: Self-pay | Admitting: Legal Medicine

## 2020-05-05 DIAGNOSIS — I272 Pulmonary hypertension, unspecified: Secondary | ICD-10-CM

## 2020-05-05 DIAGNOSIS — R778 Other specified abnormalities of plasma proteins: Secondary | ICD-10-CM

## 2020-05-05 DIAGNOSIS — J90 Pleural effusion, not elsewhere classified: Secondary | ICD-10-CM

## 2020-05-05 DIAGNOSIS — I5030 Unspecified diastolic (congestive) heart failure: Secondary | ICD-10-CM

## 2020-05-05 DIAGNOSIS — I480 Paroxysmal atrial fibrillation: Secondary | ICD-10-CM

## 2020-05-05 DIAGNOSIS — I10 Essential (primary) hypertension: Secondary | ICD-10-CM

## 2020-05-06 DIAGNOSIS — N185 Chronic kidney disease, stage 5: Secondary | ICD-10-CM

## 2020-05-06 DIAGNOSIS — I5030 Unspecified diastolic (congestive) heart failure: Secondary | ICD-10-CM

## 2020-05-06 DIAGNOSIS — I119 Hypertensive heart disease without heart failure: Secondary | ICD-10-CM

## 2020-05-06 DIAGNOSIS — I350 Nonrheumatic aortic (valve) stenosis: Secondary | ICD-10-CM

## 2020-05-07 DIAGNOSIS — I119 Hypertensive heart disease without heart failure: Secondary | ICD-10-CM | POA: Diagnosis not present

## 2020-05-07 DIAGNOSIS — N185 Chronic kidney disease, stage 5: Secondary | ICD-10-CM | POA: Diagnosis not present

## 2020-05-07 DIAGNOSIS — I350 Nonrheumatic aortic (valve) stenosis: Secondary | ICD-10-CM | POA: Diagnosis not present

## 2020-05-07 DIAGNOSIS — I5030 Unspecified diastolic (congestive) heart failure: Secondary | ICD-10-CM | POA: Diagnosis not present

## 2020-05-15 ENCOUNTER — Telehealth: Payer: Self-pay

## 2020-05-15 NOTE — Telephone Encounter (Signed)
I called patient to set up an appointment for follow up from the hospital.

## 2020-05-16 ENCOUNTER — Telehealth: Payer: Self-pay | Admitting: Legal Medicine

## 2020-05-16 DIAGNOSIS — Z7982 Long term (current) use of aspirin: Secondary | ICD-10-CM | POA: Diagnosis not present

## 2020-05-16 DIAGNOSIS — I129 Hypertensive chronic kidney disease with stage 1 through stage 4 chronic kidney disease, or unspecified chronic kidney disease: Secondary | ICD-10-CM | POA: Diagnosis not present

## 2020-05-16 DIAGNOSIS — E161 Other hypoglycemia: Secondary | ICD-10-CM | POA: Diagnosis not present

## 2020-05-16 DIAGNOSIS — E1122 Type 2 diabetes mellitus with diabetic chronic kidney disease: Secondary | ICD-10-CM | POA: Diagnosis not present

## 2020-05-16 DIAGNOSIS — Z8673 Personal history of transient ischemic attack (TIA), and cerebral infarction without residual deficits: Secondary | ICD-10-CM | POA: Diagnosis not present

## 2020-05-16 DIAGNOSIS — R609 Edema, unspecified: Secondary | ICD-10-CM | POA: Diagnosis not present

## 2020-05-16 DIAGNOSIS — E11649 Type 2 diabetes mellitus with hypoglycemia without coma: Secondary | ICD-10-CM | POA: Diagnosis not present

## 2020-05-16 DIAGNOSIS — E162 Hypoglycemia, unspecified: Secondary | ICD-10-CM | POA: Diagnosis not present

## 2020-05-16 DIAGNOSIS — R001 Bradycardia, unspecified: Secondary | ICD-10-CM | POA: Diagnosis not present

## 2020-05-16 DIAGNOSIS — J90 Pleural effusion, not elsewhere classified: Secondary | ICD-10-CM | POA: Diagnosis not present

## 2020-05-16 DIAGNOSIS — E039 Hypothyroidism, unspecified: Secondary | ICD-10-CM | POA: Diagnosis not present

## 2020-05-16 DIAGNOSIS — I132 Hypertensive heart and chronic kidney disease with heart failure and with stage 5 chronic kidney disease, or end stage renal disease: Secondary | ICD-10-CM | POA: Diagnosis not present

## 2020-05-16 DIAGNOSIS — D631 Anemia in chronic kidney disease: Secondary | ICD-10-CM | POA: Diagnosis not present

## 2020-05-16 DIAGNOSIS — I509 Heart failure, unspecified: Secondary | ICD-10-CM | POA: Diagnosis not present

## 2020-05-16 DIAGNOSIS — M1990 Unspecified osteoarthritis, unspecified site: Secondary | ICD-10-CM | POA: Diagnosis not present

## 2020-05-16 DIAGNOSIS — I2510 Atherosclerotic heart disease of native coronary artery without angina pectoris: Secondary | ICD-10-CM | POA: Diagnosis not present

## 2020-05-16 DIAGNOSIS — N184 Chronic kidney disease, stage 4 (severe): Secondary | ICD-10-CM | POA: Diagnosis not present

## 2020-05-16 DIAGNOSIS — I4891 Unspecified atrial fibrillation: Secondary | ICD-10-CM | POA: Diagnosis not present

## 2020-05-16 DIAGNOSIS — R531 Weakness: Secondary | ICD-10-CM | POA: Diagnosis not present

## 2020-05-16 DIAGNOSIS — J9811 Atelectasis: Secondary | ICD-10-CM | POA: Diagnosis not present

## 2020-05-16 DIAGNOSIS — Z79899 Other long term (current) drug therapy: Secondary | ICD-10-CM | POA: Diagnosis not present

## 2020-05-16 DIAGNOSIS — N185 Chronic kidney disease, stage 5: Secondary | ICD-10-CM | POA: Diagnosis not present

## 2020-05-16 DIAGNOSIS — Z794 Long term (current) use of insulin: Secondary | ICD-10-CM | POA: Diagnosis not present

## 2020-05-16 NOTE — Progress Notes (Signed)
  Chronic Care Management   Outreach Note  05/16/2020 Name: Connie Cooley MRN: [id number] DOB: 1940-07-08  Referred by: Lillard Anes, MD Reason for referral : Chronic Care Management   An unsuccessful telephone outreach was attempted today. The patient was referred to the pharmacist for assistance with care management and care coordination.   Follow Up Plan:   Hilario Quarry  Upstream Scheduler

## 2020-05-21 ENCOUNTER — Ambulatory Visit (INDEPENDENT_AMBULATORY_CARE_PROVIDER_SITE_OTHER): Payer: PPO | Admitting: Legal Medicine

## 2020-05-21 ENCOUNTER — Other Ambulatory Visit: Payer: Self-pay

## 2020-05-21 ENCOUNTER — Encounter: Payer: Self-pay | Admitting: Legal Medicine

## 2020-05-21 VITALS — BP 124/58 | HR 60 | Temp 97.6°F | Resp 17 | Ht 63.78 in | Wt 177.0 lb

## 2020-05-21 DIAGNOSIS — N179 Acute kidney failure, unspecified: Secondary | ICD-10-CM | POA: Insufficient documentation

## 2020-05-21 DIAGNOSIS — I5022 Chronic systolic (congestive) heart failure: Secondary | ICD-10-CM

## 2020-05-21 DIAGNOSIS — N3000 Acute cystitis without hematuria: Secondary | ICD-10-CM

## 2020-05-21 DIAGNOSIS — J45901 Unspecified asthma with (acute) exacerbation: Secondary | ICD-10-CM | POA: Insufficient documentation

## 2020-05-21 DIAGNOSIS — J9611 Chronic respiratory failure with hypoxia: Secondary | ICD-10-CM | POA: Diagnosis not present

## 2020-05-21 DIAGNOSIS — D649 Anemia, unspecified: Secondary | ICD-10-CM | POA: Insufficient documentation

## 2020-05-21 DIAGNOSIS — E538 Deficiency of other specified B group vitamins: Secondary | ICD-10-CM | POA: Insufficient documentation

## 2020-05-21 DIAGNOSIS — K2970 Gastritis, unspecified, without bleeding: Secondary | ICD-10-CM | POA: Insufficient documentation

## 2020-05-21 DIAGNOSIS — I110 Hypertensive heart disease with heart failure: Secondary | ICD-10-CM | POA: Diagnosis not present

## 2020-05-21 DIAGNOSIS — I4891 Unspecified atrial fibrillation: Secondary | ICD-10-CM | POA: Diagnosis not present

## 2020-05-21 DIAGNOSIS — I350 Nonrheumatic aortic (valve) stenosis: Secondary | ICD-10-CM | POA: Insufficient documentation

## 2020-05-21 DIAGNOSIS — E039 Hypothyroidism, unspecified: Secondary | ICD-10-CM | POA: Diagnosis not present

## 2020-05-21 DIAGNOSIS — T45511A Poisoning by anticoagulants, accidental (unintentional), initial encounter: Secondary | ICD-10-CM | POA: Insufficient documentation

## 2020-05-21 DIAGNOSIS — J9610 Chronic respiratory failure, unspecified whether with hypoxia or hypercapnia: Secondary | ICD-10-CM | POA: Insufficient documentation

## 2020-05-21 DIAGNOSIS — T383X5A Adverse effect of insulin and oral hypoglycemic [antidiabetic] drugs, initial encounter: Secondary | ICD-10-CM

## 2020-05-21 DIAGNOSIS — I509 Heart failure, unspecified: Secondary | ICD-10-CM | POA: Insufficient documentation

## 2020-05-21 DIAGNOSIS — E1121 Type 2 diabetes mellitus with diabetic nephropathy: Secondary | ICD-10-CM | POA: Diagnosis not present

## 2020-05-21 DIAGNOSIS — Z9981 Dependence on supplemental oxygen: Secondary | ICD-10-CM

## 2020-05-21 DIAGNOSIS — K922 Gastrointestinal hemorrhage, unspecified: Secondary | ICD-10-CM | POA: Insufficient documentation

## 2020-05-21 DIAGNOSIS — E1142 Type 2 diabetes mellitus with diabetic polyneuropathy: Secondary | ICD-10-CM | POA: Diagnosis not present

## 2020-05-21 DIAGNOSIS — J9601 Acute respiratory failure with hypoxia: Secondary | ICD-10-CM | POA: Diagnosis not present

## 2020-05-21 DIAGNOSIS — I119 Hypertensive heart disease without heart failure: Secondary | ICD-10-CM | POA: Insufficient documentation

## 2020-05-21 DIAGNOSIS — M109 Gout, unspecified: Secondary | ICD-10-CM | POA: Insufficient documentation

## 2020-05-21 DIAGNOSIS — Z22322 Carrier or suspected carrier of Methicillin resistant Staphylococcus aureus: Secondary | ICD-10-CM | POA: Insufficient documentation

## 2020-05-21 DIAGNOSIS — K635 Polyp of colon: Secondary | ICD-10-CM | POA: Insufficient documentation

## 2020-05-21 DIAGNOSIS — S62523A Displaced fracture of distal phalanx of unspecified thumb, initial encounter for closed fracture: Secondary | ICD-10-CM | POA: Insufficient documentation

## 2020-05-21 DIAGNOSIS — J90 Pleural effusion, not elsewhere classified: Secondary | ICD-10-CM | POA: Insufficient documentation

## 2020-05-21 DIAGNOSIS — I10 Essential (primary) hypertension: Secondary | ICD-10-CM | POA: Diagnosis not present

## 2020-05-21 DIAGNOSIS — J189 Pneumonia, unspecified organism: Secondary | ICD-10-CM | POA: Insufficient documentation

## 2020-05-21 DIAGNOSIS — N189 Chronic kidney disease, unspecified: Secondary | ICD-10-CM | POA: Insufficient documentation

## 2020-05-21 DIAGNOSIS — R0609 Other forms of dyspnea: Secondary | ICD-10-CM | POA: Insufficient documentation

## 2020-05-21 DIAGNOSIS — I272 Pulmonary hypertension, unspecified: Secondary | ICD-10-CM | POA: Insufficient documentation

## 2020-05-21 DIAGNOSIS — I5033 Acute on chronic diastolic (congestive) heart failure: Secondary | ICD-10-CM | POA: Diagnosis not present

## 2020-05-21 DIAGNOSIS — R0600 Dyspnea, unspecified: Secondary | ICD-10-CM | POA: Insufficient documentation

## 2020-05-21 DIAGNOSIS — R600 Localized edema: Secondary | ICD-10-CM | POA: Diagnosis not present

## 2020-05-21 DIAGNOSIS — E160 Drug-induced hypoglycemia without coma: Secondary | ICD-10-CM | POA: Diagnosis not present

## 2020-05-21 DIAGNOSIS — J159 Unspecified bacterial pneumonia: Secondary | ICD-10-CM | POA: Insufficient documentation

## 2020-05-21 DIAGNOSIS — E162 Hypoglycemia, unspecified: Secondary | ICD-10-CM | POA: Insufficient documentation

## 2020-05-21 DIAGNOSIS — J441 Chronic obstructive pulmonary disease with (acute) exacerbation: Secondary | ICD-10-CM | POA: Insufficient documentation

## 2020-05-21 LAB — POCT URINALYSIS DIP (CLINITEK)
Bilirubin, UA: NEGATIVE
Blood, UA: NEGATIVE
Glucose, UA: NEGATIVE mg/dL
Ketones, POC UA: NEGATIVE mg/dL
Leukocytes, UA: NEGATIVE
Nitrite, UA: NEGATIVE
POC PROTEIN,UA: >=300 — AB
Spec Grav, UA: 1.02 (ref 1.010–1.025)
Urobilinogen, UA: 0.2 E.U./dL
pH, UA: 6 (ref 5.0–8.0)

## 2020-05-21 NOTE — Progress Notes (Signed)
Subjective:  Patient ID: Connie Cooley, female    DOB: 1941/01/08  Age: 79 y.o. MRN: [id number]  Chief Complaint  Patient presents with  . Hypoglycemia  . Follow-up   All hospital and ER records reviewed HPI: transition of care and reconciliation of medicines  Hypoglycemia with pneumonia and went to Er.  BS 39 and they gave IV and treated., She has UTI and is on IV antibiotics.  Admitted October 30 to 11/5 for pneumonia.  No antibitics.  She is now on chronic O2 2L/min.  She aso had UTI.  They have changed many medicines. She is on torsemide and furoseide stopped, her hydralazine has been decresed to 37.5mg   She is depressed This patient has major depression for 2 months.  PHQ9 =8.  Patient is having more  anhedonia.  The patient has ess future plans and prospects.  The depression is worse with stress .  The patient is not exercising and working on behavior to improve mental health.  Patient isnt seeing a therapist or psychiatrist.  na  Patient is on try zoloft..   Current Outpatient Medications on File Prior to Visit  Medication Sig Dispense Refill  . acetaminophen (TYLENOL) 325 MG tablet 650 mg.    . allopurinol (ZYLOPRIM) 100 MG tablet TAKE 1 TABLET BY MOUTH ONCE DAILY 90 tablet 2  . amLODipine (NORVASC) 10 MG tablet TAKE 1 TABLET(10 MG) BY MOUTH DAILY 90 tablet 1  . aspirin 81 MG chewable tablet 81 mg.    . BD INSULIN SYRINGE U/F 31G X 5/16" 1 ML MISC 2 (two) times daily.    . carvedilol (COREG) 3.125 MG tablet Take 1 tablet (3.125 mg total) by mouth daily. 90 tablet 2  . cefUROXime (CEFTIN) 250 MG tablet SMARTSIG:1 Tablet(s) By Mouth Every 12 Hours    . cloNIDine (CATAPRES) 0.1 MG tablet Take 0.1 mg by mouth 2 (two) times daily.    Marland Kitchen doxycycline (VIBRA-TABS) 100 MG tablet Take 100 mg by mouth 2 (two) times daily.    . hydrALAZINE (APRESOLINE) 25 MG tablet 37.5 mg.    . insulin isophane & regular human (HUMULIN 70/30 MIX) (70-30) 100 UNIT/ML KwikPen Inject 20 Units into the skin  at bedtime.    . insulin isophane & regular human (HUMULIN 70/30 MIX) (70-30) 100 UNIT/ML KwikPen Inject 50 Units into the skin in the morning.    . isosorbide mononitrate (IMDUR) 60 MG 24 hr tablet TAKE 1 TABLET BY MOUTH EVERY MORNING 90 tablet 2  . JANUVIA 100 MG tablet TAKE 1 TABLET(100 MG) BY MOUTH DAILY 90 tablet 2  . levothyroxine (SYNTHROID) 125 MCG tablet TAKE 1 TABLET(125 MCG) BY MOUTH DAILY 90 tablet 2  . nitroGLYCERIN (NITROSTAT) 0.4 MG SL tablet SMARTSIG:1 Tablet(s) Sublingual PRN    . OXYGEN Inhale into the lungs. Overnight 1L    . pravastatin (PRAVACHOL) 40 MG tablet TAKE 1 TABLET BY MOUTH ONCE DAILY 90 tablet 2  . Probiotic Product (PROBIOTIC DAILY PO) Take 1 tablet by mouth daily.    . Syringe/Needle, Disp, 30G X 1/2" 1 ML MISC 1 each by Does not apply route 2 (two) times daily. 50 each 3  . torsemide (DEMADEX) 20 MG tablet Take 20 mg by mouth 2 (two) times daily.    . Vitamin D, Ergocalciferol, (DRISDOL) 1.25 MG (50000 UNIT) CAPS capsule Take 1 capsule (50,000 Units total) by mouth every 7 (seven) days. 12 capsule 2   No current facility-administered medications on file prior to visit.  Past Medical History:  Diagnosis Date  . CKD (chronic kidney disease), stage III ([place])   . Hyperlipidemia    Past Surgical History:  Procedure Laterality Date  . CHOLECYSTECTOMY  2000  . PARTIAL HYSTERECTOMY  1977    Family History  Problem Relation Age of Onset  . Leukemia Brother    Social History   Socioeconomic History  . Marital status: Divorced    Spouse name: Not on file  . Number of children: Not on file  . Years of education: Not on file  . Highest education level: Not on file  Occupational History  . Occupation: Retired  Tobacco Use  . Smoking status: Former Smoker    Types: Cigarettes    Quit date: 1990    Years since quitting: 31.8  . Smokeless tobacco: Never Used  . Tobacco comment: Quit >20 years ago  Vaping Use  . Vaping Use: Former  Substance and Sexual  Activity  . Alcohol use: No  . Drug use: No  . Sexual activity: Not Currently  Other Topics Concern  . Not on file  Social History Narrative   Follows special diet   Social Determinants of Health   Financial Resource Strain:   . Difficulty of Paying Living Expenses: Not on file  Food Insecurity:   . Worried About Charity fundraiser in the Last Year: Not on file  . Ran Out of Food in the Last Year: Not on file  Transportation Needs:   . Lack of Transportation (Medical): Not on file  . Lack of Transportation (Non-Medical): Not on file  Physical Activity:   . Days of Exercise per Week: Not on file  . Minutes of Exercise per Session: Not on file  Stress:   . Feeling of Stress : Not on file  Social Connections:   . Frequency of Communication with Friends and Family: Not on file  . Frequency of Social Gatherings with Friends and Family: Not on file  . Attends Religious Services: Not on file  . Active Member of Clubs or Organizations: Not on file  . Attends Archivist Meetings: Not on file  . Marital Status: Not on file    Review of Systems  Constitutional: Positive for activity change and appetite change.  HENT: Negative.   Eyes: Negative.   Respiratory: Negative.   Cardiovascular: Negative for chest pain, palpitations and leg swelling.  Gastrointestinal: Negative for abdominal distention and abdominal pain.  Endocrine: Negative.   Genitourinary: Negative for difficulty urinating and dyspareunia.  Musculoskeletal: Positive for arthralgias and back pain.  Skin: Negative.   Neurological: Negative.   Psychiatric/Behavioral: Positive for dysphoric mood.     Objective:  BP (!) 124/58   Pulse 60   Temp 97.6 F (36.4 C)   Resp 17   Ht 5' 3.78" (1.62 m)   Wt 177 lb (80.3 kg)   SpO2 91%   BMI 30.59 kg/m   BP/Weight 05/21/2020 03/20/2020 27/09/5007  Systolic BP 381 829 937  Diastolic BP 58 50 84  Wt. (Lbs) 177 171 -  BMI 30.59 27.6 -    Physical  Exam Vitals reviewed.  Constitutional:      Appearance: Normal appearance.  HENT:     Head: Normocephalic.     Right Ear: Tympanic membrane, ear canal and external ear normal.     Left Ear: Tympanic membrane, ear canal and external ear normal.     Mouth/Throat:     Mouth: Mucous membranes are moist.  Pharynx: Oropharynx is clear.  Eyes:     Extraocular Movements: Extraocular movements intact.     Conjunctiva/sclera: Conjunctivae normal.     Pupils: Pupils are equal, round, and reactive to light.  Cardiovascular:     Rate and Rhythm: Normal rate. Rhythm irregular.     Pulses: Normal pulses.     Heart sounds: Normal heart sounds.     Comments: Right pedal edema Pulmonary:     Effort: Pulmonary effort is normal.     Breath sounds: Normal breath sounds.  Abdominal:     General: Abdomen is flat. Bowel sounds are normal.     Palpations: Abdomen is soft.  Musculoskeletal:     Cervical back: Normal range of motion and neck supple.     Comments: Generalized weakness  Skin:    General: Skin is warm.     Capillary Refill: Capillary refill takes less than 2 seconds.  Neurological:     Mental Status: She is alert and oriented to person, place, and time.  Psychiatric:     Comments: depressed       Lab Results  Component Value Date   WBC 9.2 07/03/2019   HGB 10.9 (L) 07/03/2019   HCT 33.9 (L) 07/03/2019   PLT 141 (L) 07/03/2019   GLUCOSE 253 (H) 07/03/2019   CHOL (H) 12/31/2007    201        ATP III CLASSIFICATION:  <200     mg/dL   Desirable  200-239  mg/dL   Borderline High  >=240    mg/dL   High   TRIG 326 (H) 12/31/2007   HDL 29 (L) 12/31/2007   LDLCALC (H) 12/31/2007    107        Total Cholesterol/HDL:CHD Risk Coronary Heart Disease Risk Table                     Men   Women  1/2 Average Risk   3.4   3.3   ALT 12 07/02/2019   AST 12 (L) 07/02/2019   NA 136 07/03/2019   K 3.5 07/03/2019   CL 108 07/03/2019   CREATININE 2.60 (H) 07/03/2019   BUN 35 (H)  07/03/2019   CO2 19 (L) 07/03/2019   TSH 2.380 07/02/2019   INR 1.0 01/01/2008   HGBA1C (H) 12/31/2007    8.6 (NOTE)   The ADA recommends the following therapeutic goal for glycemic   control related to Hgb A1C measurement:   Goal of Therapy:   < 7.0% Hgb A1C   Reference: American Diabetes Association: Clinical Practice   Recommendations 2008, Diabetes Care,  2008, 31:(Suppl 1).      Assessment & Plan:   1. Hypoglycemia due to insulin patient has hypoglycemia due to pneumonia and uti and poor oral intake.   2. Chronic respiratory failure with hypoxia, on home O2 therapy ([place]) Patine has O2 sats in hospital of 88% without oxygen and 98% with 2L  3. Atrial fibrillation with RVR (HCC) Stable atrial fibrillation Patient has a diagnosis of permanent atrial fibrillation.   Patient is on no anticoagulant and has controlled ventricular response.  Patient is CV stable.  4. Essential hypertension - CBC with Differential/Platelet - Comprehensive metabolic panel An individual hypertension care plan was established and reinforced today.  The patient's status was assessed using clinical findings on exam and labs or diagnostic tests. The patient's success at meeting treatment goals on disease specific evidence-based guidelines and found to be well controlled. SELF MANAGEMENT:  The patient and I together assessed ways to personally work towards obtaining the recommended goals. RECOMMENDATIONS: avoid decongestants found in common cold remedies, decrease consumption of alcohol, perform routine monitoring of BP with home BP cuff, exercise, reduction of dietary salt, take medicines as prescribed, try not to miss doses and quit smoking.  Regular exercise and maintaining a healthy weight is needed.  Stress reduction may help. A CLINICAL SUMMARY including written plan identify barriers to care unique to individual due to social or financial issues.  We attempt to mutually creat solutions for individual and  family understanding.  5. Acute on chronic diastolic (congestive) heart failure (HCC) An individualized care plan was established and reinforced.  The patient's disease status was assessed using clinical finding son exam today, labs, and/or other diagnostic testing such as x-rays, to determine the patient's success in meeting treatmentgoalsbased on disease-based guidelines and found to beimproving. But not at goal yet. Medications prescriptions no change Laboratory tests ordered to be performed today include regualr. RECOMMENDATIONS: given include see cardiology.  Call physician is patient gains 3 lbs in one day or 5 lbs for one week.  Call for progressive PND, orthopnea or increased pedal edema.  6. Hypertensive heart disease with chronic systolic congestive heart failure ([place]) An individual hypertension care plan was established and reinforced today.  The patient's status was assessed using clinical findings on exam and labs or diagnostic tests. The patient's success at meeting treatment goals on disease specific evidence-based guidelines and found to be well controlled. SELF MANAGEMENT: The patient and I together assessed ways to personally work towards obtaining the recommended goals. RECOMMENDATIONS: avoid decongestants found in common cold remedies, decrease consumption of alcohol, perform routine monitoring of BP with home BP cuff, exercise, reduction of dietary salt, take medicines as prescribed, try not to miss doses and quit smoking.  Regular exercise and maintaining a healthy weight is needed.  Stress reduction may help. A CLINICAL SUMMARY including written plan identify barriers to care unique to individual due to social or financial issues.  We attempt to mutually creat solutions for individual and family understanding.  7. Acute hypoxemic respiratory failure ([place]) Patient is having respiratory failure and reqyuiring O2 2L/minute by [place]  8. Diabetic polyneuropathy associated with  type 2 diabetes mellitus (HCC) - Hemoglobin A1c An individual care plan for diabetes was established and reinforced today.  The patient's status was assessed using clinical findings on exam, labs and diagnostic testing. Patient success at meeting goals based on disease specific evidence-based guidelines and found to be good controlled. Medications were assessed and patient's understanding of the medical issues , including barriers were assessed. Recommend adherence to a diabetic diet, a graduated exercise program, HgbA1c level is checked quarterly, and urine microalbumin performed yearly .  Annual mono-filament sensation testing performed. Lower blood pressure and control hyperlipidemia is important. Get annual eye exams and annual flu shots and smoking cessation discussed.  Self management goals were discussed.  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  The patient's status was assessed using clinical findings on exam, labs and diagnostic testing. Patient success at meeting goals based on disease specific evidence-based guidelines and found to be good controlled. Medications were assessed and patient's understanding of the medical issues , including barriers were assessed. Recommend adherence to a diabetic diet, a graduated exercise program, HgbA1c level is checked quarterly, and urine microalbumin performed yearly .  Annual mono-filament sensation testing performed. Lower blood pressure and control  hyperlipidemia is important. Get annual eye exams and annual flu shots and smoking cessation discussed.  Self management goals were discussed.  10. Edema of right lower extremity Patient is having progressive edema on right lower leg, increase torsemide for one week  11. Acute cystitis without hematuria - POCT URINALYSIS DIP ([facility]) UTI has resolved  12. Acquired hypothyroidism - TSH Patient is known to have hypothyroidism and is n  treatment .  Patient was diagnosed 10 years ago.  Other treatment includes none.  Patient is compliant with medicines and last TSH 6 months ago.  Last TSH was normal.      Orders Placed This Encounter  Procedures  . CBC with Differential/Platelet  . Comprehensive metabolic panel  . TSH  . Hemoglobin A1c  . POCT URINALYSIS DIP ([facility])     I spent 30 minutes dedicated to the care of this patient on the date of this encounter to include face-to-face time with the patient, as well as: Preparing to see the patient (review of tests). Obtaining and/or reviewing separately obtained history. Performing a medically appropriate examination and/or evaluation. Counseling and educating the patient/family/caregiver. Ordering medications, tests, or procedures.  Documenting clinical information in the electronic or other health record. Independently interpreting results (not separately reported) and communicating results to the patient/family/caregiver.  My nursing staff have aided in the documentation of this note on the behalf of Reinaldo Meeker, MD,as directed by  Reinaldo Meeker, MD and thoroughly reviewed by Reinaldo Meeker, MD.  Follow-up: Return in about 1 week (around 05/28/2020) for chf.  An After Visit Summary was printed and given to the patient.  Reinaldo Meeker, MD [facility] [phone]

## 2020-05-21 NOTE — Patient Instructions (Signed)
Increase torsemide to 3 pills a day for one weel Elevate legs Use chronic O2 Follow up ine week. Take zoloft for depression

## 2020-05-22 DIAGNOSIS — J9601 Acute respiratory failure with hypoxia: Secondary | ICD-10-CM | POA: Diagnosis not present

## 2020-05-22 DIAGNOSIS — I130 Hypertensive heart and chronic kidney disease with heart failure and stage 1 through stage 4 chronic kidney disease, or unspecified chronic kidney disease: Secondary | ICD-10-CM | POA: Diagnosis not present

## 2020-05-22 DIAGNOSIS — I4891 Unspecified atrial fibrillation: Secondary | ICD-10-CM | POA: Diagnosis not present

## 2020-05-22 DIAGNOSIS — M519 Unspecified thoracic, thoracolumbar and lumbosacral intervertebral disc disorder: Secondary | ICD-10-CM | POA: Diagnosis not present

## 2020-05-22 DIAGNOSIS — I480 Paroxysmal atrial fibrillation: Secondary | ICD-10-CM | POA: Diagnosis not present

## 2020-05-22 DIAGNOSIS — N179 Acute kidney failure, unspecified: Secondary | ICD-10-CM | POA: Diagnosis not present

## 2020-05-22 DIAGNOSIS — D631 Anemia in chronic kidney disease: Secondary | ICD-10-CM | POA: Diagnosis not present

## 2020-05-22 DIAGNOSIS — F419 Anxiety disorder, unspecified: Secondary | ICD-10-CM | POA: Diagnosis not present

## 2020-05-22 DIAGNOSIS — Z87891 Personal history of nicotine dependence: Secondary | ICD-10-CM | POA: Diagnosis not present

## 2020-05-22 DIAGNOSIS — R7889 Finding of other specified substances, not normally found in blood: Secondary | ICD-10-CM | POA: Diagnosis not present

## 2020-05-22 DIAGNOSIS — J918 Pleural effusion in other conditions classified elsewhere: Secondary | ICD-10-CM | POA: Diagnosis not present

## 2020-05-22 DIAGNOSIS — E872 Acidosis: Secondary | ICD-10-CM | POA: Diagnosis not present

## 2020-05-22 DIAGNOSIS — J441 Chronic obstructive pulmonary disease with (acute) exacerbation: Secondary | ICD-10-CM | POA: Diagnosis not present

## 2020-05-22 DIAGNOSIS — E1142 Type 2 diabetes mellitus with diabetic polyneuropathy: Secondary | ICD-10-CM | POA: Diagnosis not present

## 2020-05-22 DIAGNOSIS — Z888 Allergy status to other drugs, medicaments and biological substances status: Secondary | ICD-10-CM | POA: Diagnosis not present

## 2020-05-22 DIAGNOSIS — Z8673 Personal history of transient ischemic attack (TIA), and cerebral infarction without residual deficits: Secondary | ICD-10-CM | POA: Diagnosis not present

## 2020-05-22 DIAGNOSIS — I361 Nonrheumatic tricuspid (valve) insufficiency: Secondary | ICD-10-CM | POA: Diagnosis not present

## 2020-05-22 DIAGNOSIS — M109 Gout, unspecified: Secondary | ICD-10-CM | POA: Diagnosis not present

## 2020-05-22 DIAGNOSIS — E46 Unspecified protein-calorie malnutrition: Secondary | ICD-10-CM | POA: Diagnosis not present

## 2020-05-22 DIAGNOSIS — E7800 Pure hypercholesterolemia, unspecified: Secondary | ICD-10-CM | POA: Diagnosis not present

## 2020-05-22 DIAGNOSIS — J9811 Atelectasis: Secondary | ICD-10-CM | POA: Diagnosis not present

## 2020-05-22 DIAGNOSIS — E11649 Type 2 diabetes mellitus with hypoglycemia without coma: Secondary | ICD-10-CM | POA: Diagnosis not present

## 2020-05-22 DIAGNOSIS — I160 Hypertensive urgency: Secondary | ICD-10-CM | POA: Diagnosis not present

## 2020-05-22 DIAGNOSIS — J45901 Unspecified asthma with (acute) exacerbation: Secondary | ICD-10-CM | POA: Diagnosis not present

## 2020-05-22 DIAGNOSIS — E1165 Type 2 diabetes mellitus with hyperglycemia: Secondary | ICD-10-CM | POA: Diagnosis not present

## 2020-05-22 DIAGNOSIS — R41841 Cognitive communication deficit: Secondary | ICD-10-CM | POA: Diagnosis not present

## 2020-05-22 DIAGNOSIS — N189 Chronic kidney disease, unspecified: Secondary | ICD-10-CM | POA: Diagnosis not present

## 2020-05-22 DIAGNOSIS — I5033 Acute on chronic diastolic (congestive) heart failure: Secondary | ICD-10-CM | POA: Diagnosis not present

## 2020-05-22 DIAGNOSIS — F32A Depression, unspecified: Secondary | ICD-10-CM | POA: Diagnosis not present

## 2020-05-22 DIAGNOSIS — I2510 Atherosclerotic heart disease of native coronary artery without angina pectoris: Secondary | ICD-10-CM | POA: Diagnosis not present

## 2020-05-22 DIAGNOSIS — F0390 Unspecified dementia without behavioral disturbance: Secondary | ICD-10-CM | POA: Diagnosis not present

## 2020-05-22 DIAGNOSIS — N2889 Other specified disorders of kidney and ureter: Secondary | ICD-10-CM | POA: Diagnosis not present

## 2020-05-22 DIAGNOSIS — R001 Bradycardia, unspecified: Secondary | ICD-10-CM | POA: Diagnosis not present

## 2020-05-22 DIAGNOSIS — E039 Hypothyroidism, unspecified: Secondary | ICD-10-CM | POA: Diagnosis not present

## 2020-05-22 DIAGNOSIS — N184 Chronic kidney disease, stage 4 (severe): Secondary | ICD-10-CM | POA: Diagnosis not present

## 2020-05-22 DIAGNOSIS — E875 Hyperkalemia: Secondary | ICD-10-CM | POA: Diagnosis not present

## 2020-05-22 DIAGNOSIS — M1990 Unspecified osteoarthritis, unspecified site: Secondary | ICD-10-CM | POA: Diagnosis not present

## 2020-05-22 DIAGNOSIS — I517 Cardiomegaly: Secondary | ICD-10-CM | POA: Diagnosis not present

## 2020-05-22 DIAGNOSIS — Z794 Long term (current) use of insulin: Secondary | ICD-10-CM | POA: Diagnosis not present

## 2020-05-22 DIAGNOSIS — Z87440 Personal history of urinary (tract) infections: Secondary | ICD-10-CM | POA: Diagnosis not present

## 2020-05-22 DIAGNOSIS — R531 Weakness: Secondary | ICD-10-CM | POA: Diagnosis not present

## 2020-05-22 DIAGNOSIS — I509 Heart failure, unspecified: Secondary | ICD-10-CM | POA: Diagnosis not present

## 2020-05-22 DIAGNOSIS — N183 Chronic kidney disease, stage 3 unspecified: Secondary | ICD-10-CM | POA: Diagnosis not present

## 2020-05-22 DIAGNOSIS — G9341 Metabolic encephalopathy: Secondary | ICD-10-CM | POA: Diagnosis not present

## 2020-05-22 DIAGNOSIS — E1122 Type 2 diabetes mellitus with diabetic chronic kidney disease: Secondary | ICD-10-CM | POA: Diagnosis not present

## 2020-05-22 DIAGNOSIS — I700 Atherosclerosis of aorta: Secondary | ICD-10-CM | POA: Diagnosis not present

## 2020-05-22 DIAGNOSIS — Z9981 Dependence on supplemental oxygen: Secondary | ICD-10-CM | POA: Diagnosis not present

## 2020-05-22 DIAGNOSIS — J810 Acute pulmonary edema: Secondary | ICD-10-CM | POA: Diagnosis not present

## 2020-05-22 DIAGNOSIS — M1991 Primary osteoarthritis, unspecified site: Secondary | ICD-10-CM | POA: Diagnosis not present

## 2020-05-22 DIAGNOSIS — J90 Pleural effusion, not elsewhere classified: Secondary | ICD-10-CM | POA: Diagnosis not present

## 2020-05-22 DIAGNOSIS — N3000 Acute cystitis without hematuria: Secondary | ICD-10-CM | POA: Diagnosis not present

## 2020-05-22 DIAGNOSIS — M6281 Muscle weakness (generalized): Secondary | ICD-10-CM | POA: Diagnosis not present

## 2020-05-22 DIAGNOSIS — J9621 Acute and chronic respiratory failure with hypoxia: Secondary | ICD-10-CM | POA: Diagnosis not present

## 2020-05-22 DIAGNOSIS — Z9181 History of falling: Secondary | ICD-10-CM | POA: Diagnosis not present

## 2020-05-22 LAB — CBC WITH DIFFERENTIAL/PLATELET
Basophils Absolute: 0 x10E3/uL (ref 0.0–0.2)
Basos: 1 %
EOS (ABSOLUTE): 0.2 x10E3/uL (ref 0.0–0.4)
Eos: 3 %
Hematocrit: 26.8 % — ABNORMAL LOW (ref 34.0–46.6)
Hemoglobin: 8.4 g/dL — ABNORMAL LOW (ref 11.1–15.9)
Immature Grans (Abs): 0 x10E3/uL (ref 0.0–0.1)
Immature Granulocytes: 0 %
Lymphocytes Absolute: 0.8 x10E3/uL (ref 0.7–3.1)
Lymphs: 9 %
MCH: 26.8 pg (ref 26.6–33.0)
MCHC: 31.3 g/dL — ABNORMAL LOW (ref 31.5–35.7)
MCV: 86 fL (ref 79–97)
Monocytes Absolute: 0.4 x10E3/uL (ref 0.1–0.9)
Monocytes: 5 %
Neutrophils Absolute: 7.3 x10E3/uL — ABNORMAL HIGH (ref 1.4–7.0)
Neutrophils: 82 %
Platelets: 171 x10E3/uL (ref 150–450)
RBC: 3.13 x10E6/uL — ABNORMAL LOW (ref 3.77–5.28)
RDW: 15.8 % — ABNORMAL HIGH (ref 11.7–15.4)
WBC: 8.8 x10E3/uL (ref 3.4–10.8)

## 2020-05-22 LAB — COMPREHENSIVE METABOLIC PANEL
ALT: 22 IU/L (ref 0–32)
AST: 24 IU/L (ref 0–40)
Albumin/Globulin Ratio: 1.5 (ref 1.2–2.2)
Albumin: 3.7 g/dL (ref 3.7–4.7)
Alkaline Phosphatase: 67 IU/L (ref 44–121)
BUN/Creatinine Ratio: 20 (ref 12–28)
BUN: 73 mg/dL — ABNORMAL HIGH (ref 8–27)
Bilirubin Total: 0.3 mg/dL (ref 0.0–1.2)
CO2: 14 mmol/L — ABNORMAL LOW (ref 20–29)
Calcium: 8.7 mg/dL (ref 8.7–10.3)
Chloride: 99 mmol/L (ref 96–106)
Creatinine, Ser: 3.63 mg/dL — ABNORMAL HIGH (ref 0.57–1.00)
GFR calc Af Amer: 13 mL/min/{1.73_m2} — ABNORMAL LOW (ref >59)
GFR calc non Af Amer: 11 mL/min/{1.73_m2} — ABNORMAL LOW (ref >59)
Globulin, Total: 2.5 g/dL (ref 1.5–4.5)
Glucose: 149 mg/dL — ABNORMAL HIGH (ref 65–99)
Potassium: 5.4 mmol/L — ABNORMAL HIGH (ref 3.5–5.2)
Sodium: 138 mmol/L (ref 134–144)
Total Protein: 6.2 g/dL (ref 6.0–8.5)

## 2020-05-22 LAB — HEMOGLOBIN A1C
Est. average glucose Bld gHb Est-mCnc: 146 mg/dL
Hgb A1c MFr Bld: 6.7 % — ABNORMAL HIGH (ref 4.8–5.6)

## 2020-05-22 LAB — TSH: TSH: 1.48 u[IU]/mL (ref 0.450–4.500)

## 2020-05-22 NOTE — Progress Notes (Signed)
Hemoglobin lower 8.4, hematocrit 26.8 - is she bleeding, glucose high 149, kidney tests are worse, potassium 5.4, TSH normal- need to go to hospital

## 2020-05-23 DIAGNOSIS — J90 Pleural effusion, not elsewhere classified: Secondary | ICD-10-CM | POA: Diagnosis not present

## 2020-05-23 DIAGNOSIS — J9811 Atelectasis: Secondary | ICD-10-CM | POA: Diagnosis not present

## 2020-05-26 DIAGNOSIS — J90 Pleural effusion, not elsewhere classified: Secondary | ICD-10-CM | POA: Diagnosis not present

## 2020-05-26 DIAGNOSIS — I130 Hypertensive heart and chronic kidney disease with heart failure and stage 1 through stage 4 chronic kidney disease, or unspecified chronic kidney disease: Secondary | ICD-10-CM

## 2020-05-30 DIAGNOSIS — I361 Nonrheumatic tricuspid (valve) insufficiency: Secondary | ICD-10-CM

## 2020-05-30 DIAGNOSIS — N2889 Other specified disorders of kidney and ureter: Secondary | ICD-10-CM | POA: Diagnosis not present

## 2020-06-04 ENCOUNTER — Ambulatory Visit: Payer: PPO | Admitting: Legal Medicine

## 2020-06-05 DIAGNOSIS — I517 Cardiomegaly: Secondary | ICD-10-CM | POA: Diagnosis not present

## 2020-06-05 DIAGNOSIS — R531 Weakness: Secondary | ICD-10-CM | POA: Diagnosis not present

## 2020-06-05 DIAGNOSIS — J90 Pleural effusion, not elsewhere classified: Secondary | ICD-10-CM | POA: Diagnosis not present

## 2020-06-08 DIAGNOSIS — I509 Heart failure, unspecified: Secondary | ICD-10-CM | POA: Diagnosis not present

## 2020-06-09 DIAGNOSIS — I130 Hypertensive heart and chronic kidney disease with heart failure and stage 1 through stage 4 chronic kidney disease, or unspecified chronic kidney disease: Secondary | ICD-10-CM | POA: Diagnosis not present

## 2020-06-12 ENCOUNTER — Ambulatory Visit (INDEPENDENT_AMBULATORY_CARE_PROVIDER_SITE_OTHER): Payer: PPO | Admitting: Legal Medicine

## 2020-06-12 ENCOUNTER — Encounter: Payer: Self-pay | Admitting: Legal Medicine

## 2020-06-12 ENCOUNTER — Other Ambulatory Visit: Payer: Self-pay

## 2020-06-12 VITALS — BP 132/70 | HR 61 | Temp 97.3°F | Resp 18 | Ht 63.78 in | Wt 169.8 lb

## 2020-06-12 DIAGNOSIS — N184 Chronic kidney disease, stage 4 (severe): Secondary | ICD-10-CM | POA: Diagnosis not present

## 2020-06-12 DIAGNOSIS — F419 Anxiety disorder, unspecified: Secondary | ICD-10-CM

## 2020-06-12 DIAGNOSIS — I5033 Acute on chronic diastolic (congestive) heart failure: Secondary | ICD-10-CM | POA: Diagnosis not present

## 2020-06-12 DIAGNOSIS — N171 Acute kidney failure with acute cortical necrosis: Secondary | ICD-10-CM

## 2020-06-12 DIAGNOSIS — J9611 Chronic respiratory failure with hypoxia: Secondary | ICD-10-CM | POA: Diagnosis not present

## 2020-06-12 MED ORDER — LORAZEPAM 0.5 MG PO TABS: 0.5000 mg | ORAL_TABLET | Freq: Two times a day (BID) | ORAL | 2 refills | Status: AC | PRN

## 2020-06-12 NOTE — Progress Notes (Signed)
Subjective:  Patient ID: Connie Cooley, female    DOB: 1940-11-05  Age: 79 y.o. MRN: [id number]  Chief Complaint  Patient presents with  . Transitions Of Care    Patient admitted to [facility] 05/22/20- 06/07/2020.    HPI: transition of care and reconciliation of medicines.  Patient admitted 11/18/2021for acute CHF and chronic renal failure. She was diuresed and [place] [facility] consulted.  Renal function are now stable.  Discharge weight 166.  She is now 168.  She had a thoracentesis of pleural effusion, transudate. Creatinine was as high as 4.2, creatinine 3.6 on dischahrge.  NO appointment for renal yet.  Patient present with type 2 diabetes.  Specifically, this is type 2, insulin 70/30 30 units in PM requiring diabetes, complicated by renal disease.  Compliance with treatment has been good; patient take medicines as directed, maintains diet and exercise regimen, follows up as directed, and is keeping glucose diary.  Date of  diagnosis 2010.  Depression screen has been performed.Tobacco screen nonsmoker. Current medicines for diabetes insulin 70/30 30 units..  Patient is on none for renal protection and pravastatin for cholesterol control.  Patient performs foot exams daily and last ophthalmologic exam was no  Patient presents with HFrEF  that is stable. Diagnosis made 20120  The course of the disease is stable.  Current medicines include carvedilol, torsemide. Patient follows a low cholesterol diet and maintains a weight diary.  Patient is on low salt, low cholesterol diet and avoids alcohol.  Patient denies adverse effects of medicines. Patient is monitoring weight and has pained 3 lbs of weight.  Patient is having some pedal edema, some PND and some PND.  Patient is continuing to see cardiology..   Current Outpatient Medications on File Prior to Visit  Medication Sig Dispense Refill  . acetaminophen (TYLENOL) 325 MG tablet 650 mg.    . allopurinol (ZYLOPRIM) 100 MG tablet TAKE 1  TABLET BY MOUTH ONCE DAILY 90 tablet 2  . amLODipine (NORVASC) 10 MG tablet TAKE 1 TABLET(10 MG) BY MOUTH DAILY 90 tablet 1  . aspirin 81 MG chewable tablet 81 mg.    . BD INSULIN SYRINGE U/F 31G X 5/16" 1 ML MISC 2 (two) times daily.    . carvedilol (COREG) 3.125 MG tablet Take 1 tablet (3.125 mg total) by mouth daily. 90 tablet 2  . cefUROXime (CEFTIN) 250 MG tablet SMARTSIG:1 Tablet(s) By Mouth Every 12 Hours    . cloNIDine (CATAPRES) 0.1 MG tablet Take 0.1 mg by mouth 2 (two) times daily.    . hydrALAZINE (APRESOLINE) 25 MG tablet 37.5 mg.    . insulin isophane & regular human (HUMULIN 70/30 MIX) (70-30) 100 UNIT/ML KwikPen Inject 20 Units into the skin at bedtime.    . insulin isophane & regular human (HUMULIN 70/30 MIX) (70-30) 100 UNIT/ML KwikPen Inject 50 Units into the skin in the morning.    . isosorbide mononitrate (IMDUR) 60 MG 24 hr tablet TAKE 1 TABLET BY MOUTH EVERY MORNING 90 tablet 2  . levothyroxine (SYNTHROID) 125 MCG tablet TAKE 1 TABLET(125 MCG) BY MOUTH DAILY 90 tablet 2  . nitroGLYCERIN (NITROSTAT) 0.4 MG SL tablet SMARTSIG:1 Tablet(s) Sublingual PRN    . OXYGEN Inhale into the lungs. Overnight 1L    . pravastatin (PRAVACHOL) 40 MG tablet TAKE 1 TABLET BY MOUTH ONCE DAILY 90 tablet 2  . Probiotic Product (PROBIOTIC DAILY PO) Take 1 tablet by mouth daily.    . Syringe/Needle, Disp, 30G X 1/2" 1 ML  MISC 1 each by Does not apply route 2 (two) times daily. 50 each 3  . torsemide (DEMADEX) 20 MG tablet Take 20 mg by mouth 2 (two) times daily.    . Vitamin D, Ergocalciferol, (DRISDOL) 1.25 MG (50000 UNIT) CAPS capsule Take 1 capsule (50,000 Units total) by mouth every 7 (seven) days. 12 capsule 2   No current facility-administered medications on file prior to visit.   Past Medical History:  Diagnosis Date  . CKD (chronic kidney disease), stage III ([place])   . Hyperlipidemia    Past Surgical History:  Procedure Laterality Date  . CHOLECYSTECTOMY  2000  . PARTIAL  HYSTERECTOMY  1977    Family History  Problem Relation Age of Onset  . Leukemia Brother    Social History   Socioeconomic History  . Marital status: Divorced    Spouse name: Not on file  . Number of children: Not on file  . Years of education: Not on file  . Highest education level: Not on file  Occupational History  . Occupation: Retired  Tobacco Use  . Smoking status: Former Smoker    Types: Cigarettes    Quit date: 1990    Years since quitting: 31.9  . Smokeless tobacco: Never Used  . Tobacco comment: Quit >20 years ago  Vaping Use  . Vaping Use: Former  Substance and Sexual Activity  . Alcohol use: No  . Drug use: No  . Sexual activity: Not Currently  Other Topics Concern  . Not on file  Social History Narrative   Follows special diet   Social Determinants of Health   Financial Resource Strain: Not on file  Food Insecurity: Not on file  Transportation Needs: Not on file  Physical Activity: Not on file  Stress: Not on file  Social Connections: Not on file    Review of Systems  Constitutional: Negative for activity change, appetite change and fatigue.  HENT: Negative for congestion and sinus pain.   Eyes: Positive for visual disturbance.  Respiratory: Positive for shortness of breath. Negative for cough, choking and wheezing.   Cardiovascular: Negative for chest pain, palpitations and leg swelling.  Endocrine: Negative for polyuria.  Genitourinary: Negative for difficulty urinating and urgency.  Musculoskeletal: Positive for arthralgias and gait problem.       In wheel chair  Skin: Negative.   Psychiatric/Behavioral: Negative for agitation, behavioral problems and confusion.     Objective:  BP 132/70 (BP Location: Left Arm, Patient Position: Sitting, Cuff Size: Normal)   Pulse 61   Temp (!) 97.3 F (36.3 C) (Temporal)   Resp 18   Ht 5' 3.78" (1.62 m)   Wt 169 lb 12.8 oz (77 kg)   SpO2 95%   BMI 29.35 kg/m   BP/Weight 06/12/2020 05/21/2020  0/26/3785  Systolic BP 885 027 741  Diastolic BP 70 58 50  Wt. (Lbs) 169.8 177 171  BMI 29.35 30.59 27.6    Physical Exam Vitals reviewed.  Constitutional:      Appearance: Normal appearance.  HENT:     Head: Normocephalic.     Right Ear: Tympanic membrane, ear canal and external ear normal.     Left Ear: Tympanic membrane, ear canal and external ear normal.     Mouth/Throat:     Pharynx: Oropharynx is clear.  Eyes:     Extraocular Movements: Extraocular movements intact.     Conjunctiva/sclera: Conjunctivae normal.     Pupils: Pupils are equal, round, and reactive to light.  Cardiovascular:  Rate and Rhythm: Normal rate and regular rhythm.     Pulses: Normal pulses.     Heart sounds: Normal heart sounds.  Pulmonary:     Effort: Pulmonary effort is normal.     Breath sounds: Normal breath sounds. No wheezing, rhonchi or rales.     Comments: Dullness right lung Abdominal:     General: Abdomen is flat. Bowel sounds are normal. There is no distension.     Palpations: Abdomen is soft.     Tenderness: There is no abdominal tenderness.  Musculoskeletal:     Cervical back: Normal range of motion and neck supple.     Right lower leg: Edema present.     Left lower leg: Edema present.  Skin:    Capillary Refill: Capillary refill takes less than 2 seconds.     Findings: Bruising present.  Neurological:     General: No focal deficit present.     Mental Status: She is alert and oriented to person, place, and time. Mental status is at baseline.     Motor: Weakness present.  Psychiatric:        Mood and Affect: Mood normal.        Behavior: Behavior normal.        Thought Content: Thought content normal.        Judgment: Judgment normal.     Diabetic Foot Exam - Simple   Simple Foot Form Diabetic Foot exam was performed with the following findings: Yes 06/12/2020  3:46 PM  Visual Inspection No deformities, no ulcerations, no other skin breakdown bilaterally: Yes Sensation  Testing See comments: Yes Pulse Check Posterior Tibialis and Dorsalis pulse intact bilaterally: Yes Comments No sensation feet      Lab Results  Component Value Date   WBC 8.8 05/21/2020   HGB 8.4 (L) 05/21/2020   HCT 26.8 (L) 05/21/2020   PLT 171 05/21/2020   GLUCOSE 149 (H) 05/21/2020   CHOL (H) 12/31/2007    201        ATP III CLASSIFICATION:  <200     mg/dL   Desirable  200-239  mg/dL   Borderline High  >=240    mg/dL   High   TRIG 326 (H) 12/31/2007   HDL 29 (L) 12/31/2007   LDLCALC (H) 12/31/2007    107        Total Cholesterol/HDL:CHD Risk Coronary Heart Disease Risk Table                     Men   Women  1/2 Average Risk   3.4   3.3   ALT 22 05/21/2020   AST 24 05/21/2020   NA 138 05/21/2020   K 5.4 (H) 05/21/2020   CL 99 05/21/2020   CREATININE 3.63 (H) 05/21/2020   BUN 73 (H) 05/21/2020   CO2 14 (L) 05/21/2020   TSH 1.480 05/21/2020   INR 1.0 01/01/2008   HGBA1C 6.7 (H) 05/21/2020      Assessment & Plan:   1. Acute on chronic diastolic (congestive) heart failure (HCC) - Comprehensive metabolic panel - Pro b natriuretic peptide Patient was recently discharged from the hospital with chronic heart failure and had to be diuresed patient required oxygen for stabilization.  Patient is presently in wheelchair and unable to ambulate on her own.  She does have some edema of both legs but she now has a new chair that will elevate her legs well.  She does have some minimal orthopnea and PND  as well as a 3 pound weight gain which we need to watch very closely.  2. 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  She saw [place] [facility] when the creatinine was 4.2 it decreased to 3.7 by the time of discharge.  We will have to be sure she does follow-up with her renal doctors as well as  cardiology.  3. Anxiety - LORazepam (ATIVAN) 0.5 MG tablet; Take 1 tablet (0.5 mg total) by mouth 2 (two) times daily as needed for anxiety.  Dispense: 30 tablet; Refill: 2 Patient is having further anxiety especially at night probably related to her heart failure and low oxygen.  We will try a low dose of lorazepam to use as needed for this.  4. Chronic respiratory failure with hypoxia (HCC) Patient has chronic respiratory failure with hypoxia and is on continuous oxygen 1 L/min.  She does have portable oxygen but she did not wear that today.  She uses [facility].  Meds ordered this encounter  Medications  . LORazepam (ATIVAN) 0.5 MG tablet    Sig: Take 1 tablet (0.5 mg total) by mouth 2 (two) times daily as needed for anxiety.    Dispense:  30 tablet    Refill:  2    Orders Placed This Encounter  Procedures  . Comprehensive metabolic panel  . CBC with Differential/Platelet  . Pro b natriuretic peptide      I spent 30 minutes dedicated to the care of this patient on the date of this encounter to include face-to-face time with the patient, as well as: Reviewing all hospital records as well as discussing her medical condition with family and her son which is present.  We will follow-up again in about 1 month or sooner if she has further weight gain.  Follow-up: Return in about 1 month (around 07/13/2020).  An After Visit Summary was printed and given to the patient.  Reinaldo Meeker, MD [facility] [phone]

## 2020-06-13 ENCOUNTER — Other Ambulatory Visit: Payer: Self-pay | Admitting: Legal Medicine

## 2020-06-13 DIAGNOSIS — I10 Essential (primary) hypertension: Secondary | ICD-10-CM

## 2020-06-13 LAB — COMPREHENSIVE METABOLIC PANEL
ALT: 19 IU/L (ref 0–32)
AST: 18 IU/L (ref 0–40)
Albumin/Globulin Ratio: 1.5 (ref 1.2–2.2)
Albumin: 3.7 g/dL (ref 3.7–4.7)
Alkaline Phosphatase: 64 IU/L (ref 44–121)
BUN/Creatinine Ratio: 15 (ref 12–28)
BUN: 53 mg/dL — ABNORMAL HIGH (ref 8–27)
Bilirubin Total: <0.2 mg/dL (ref 0.0–1.2)
CO2: 25 mmol/L (ref 20–29)
Calcium: 8.7 mg/dL (ref 8.7–10.3)
Chloride: 102 mmol/L (ref 96–106)
Creatinine, Ser: 3.58 mg/dL — ABNORMAL HIGH (ref 0.57–1.00)
GFR calc Af Amer: 13 mL/min/{1.73_m2} — ABNORMAL LOW (ref >59)
GFR calc non Af Amer: 11 mL/min/{1.73_m2} — ABNORMAL LOW (ref >59)
Globulin, Total: 2.4 g/dL (ref 1.5–4.5)
Glucose: 108 mg/dL — ABNORMAL HIGH (ref 65–99)
Potassium: 4.6 mmol/L (ref 3.5–5.2)
Sodium: 142 mmol/L (ref 134–144)
Total Protein: 6.1 g/dL (ref 6.0–8.5)

## 2020-06-13 LAB — CBC WITH DIFFERENTIAL/PLATELET
Basophils Absolute: 0 10*3/uL (ref 0.0–0.2)
Basos: 0 %
EOS (ABSOLUTE): 0.4 10*3/uL (ref 0.0–0.4)
Eos: 4 %
Hematocrit: 25.7 % — ABNORMAL LOW (ref 34.0–46.6)
Hemoglobin: 8 g/dL — ABNORMAL LOW (ref 11.1–15.9)
Immature Grans (Abs): 0 10*3/uL (ref 0.0–0.1)
Immature Granulocytes: 0 %
Lymphocytes Absolute: 1.2 10*3/uL (ref 0.7–3.1)
Lymphs: 10 %
MCH: 26.6 pg (ref 26.6–33.0)
MCHC: 31.1 g/dL — ABNORMAL LOW (ref 31.5–35.7)
MCV: 85 fL (ref 79–97)
Monocytes Absolute: 0.9 10*3/uL (ref 0.1–0.9)
Monocytes: 8 %
Neutrophils Absolute: 8.9 10*3/uL — ABNORMAL HIGH (ref 1.4–7.0)
Neutrophils: 78 %
Platelets: 200 10*3/uL (ref 150–450)
RBC: 3.01 x10E6/uL — ABNORMAL LOW (ref 3.77–5.28)
RDW: 15.6 % — ABNORMAL HIGH (ref 11.7–15.4)
WBC: 11.5 10*3/uL — ABNORMAL HIGH (ref 3.4–10.8)

## 2020-06-13 LAB — PRO B NATRIURETIC PEPTIDE: NT-Pro BNP: 2828 pg/mL — ABNORMAL HIGH (ref 0–738)

## 2020-06-13 MED ORDER — HYDRALAZINE HCL 25 MG PO TABS: 37.5000 mg | ORAL_TABLET | Freq: Three times a day (TID) | ORAL | 6 refills | Status: AC

## 2020-06-13 NOTE — Progress Notes (Signed)
Kidney tests improved. Potassium 5.4 reck one week, liver tests ok, anemia- tae multivitamin plus iron , BNP 2,82 still in failure, notify us if any increase in weight lp

## 2020-06-17 ENCOUNTER — Other Ambulatory Visit: Payer: Self-pay | Admitting: Legal Medicine

## 2020-06-21 DIAGNOSIS — E1122 Type 2 diabetes mellitus with diabetic chronic kidney disease: Secondary | ICD-10-CM | POA: Diagnosis not present

## 2020-06-21 DIAGNOSIS — I700 Atherosclerosis of aorta: Secondary | ICD-10-CM | POA: Diagnosis not present

## 2020-06-21 DIAGNOSIS — R0902 Hypoxemia: Secondary | ICD-10-CM | POA: Diagnosis not present

## 2020-06-21 DIAGNOSIS — N184 Chronic kidney disease, stage 4 (severe): Secondary | ICD-10-CM | POA: Diagnosis not present

## 2020-06-21 DIAGNOSIS — J90 Pleural effusion, not elsewhere classified: Secondary | ICD-10-CM | POA: Diagnosis not present

## 2020-06-21 DIAGNOSIS — E039 Hypothyroidism, unspecified: Secondary | ICD-10-CM | POA: Diagnosis not present

## 2020-06-21 DIAGNOSIS — M519 Unspecified thoracic, thoracolumbar and lumbosacral intervertebral disc disorder: Secondary | ICD-10-CM | POA: Diagnosis not present

## 2020-06-21 DIAGNOSIS — E1165 Type 2 diabetes mellitus with hyperglycemia: Secondary | ICD-10-CM | POA: Diagnosis not present

## 2020-06-21 DIAGNOSIS — M1991 Primary osteoarthritis, unspecified site: Secondary | ICD-10-CM | POA: Diagnosis not present

## 2020-06-21 DIAGNOSIS — I10 Essential (primary) hypertension: Secondary | ICD-10-CM | POA: Diagnosis not present

## 2020-06-21 DIAGNOSIS — Z9181 History of falling: Secondary | ICD-10-CM | POA: Diagnosis not present

## 2020-06-21 DIAGNOSIS — Z87440 Personal history of urinary (tract) infections: Secondary | ICD-10-CM | POA: Diagnosis not present

## 2020-06-21 DIAGNOSIS — J9621 Acute and chronic respiratory failure with hypoxia: Secondary | ICD-10-CM | POA: Diagnosis not present

## 2020-06-21 DIAGNOSIS — F32A Depression, unspecified: Secondary | ICD-10-CM | POA: Diagnosis not present

## 2020-06-21 DIAGNOSIS — E872 Acidosis: Secondary | ICD-10-CM | POA: Diagnosis not present

## 2020-06-21 DIAGNOSIS — M109 Gout, unspecified: Secondary | ICD-10-CM | POA: Diagnosis not present

## 2020-06-21 DIAGNOSIS — R41841 Cognitive communication deficit: Secondary | ICD-10-CM | POA: Diagnosis not present

## 2020-06-21 DIAGNOSIS — I160 Hypertensive urgency: Secondary | ICD-10-CM | POA: Diagnosis not present

## 2020-06-21 DIAGNOSIS — J45901 Unspecified asthma with (acute) exacerbation: Secondary | ICD-10-CM | POA: Diagnosis not present

## 2020-06-21 DIAGNOSIS — D631 Anemia in chronic kidney disease: Secondary | ICD-10-CM | POA: Diagnosis not present

## 2020-06-21 DIAGNOSIS — I2510 Atherosclerotic heart disease of native coronary artery without angina pectoris: Secondary | ICD-10-CM | POA: Diagnosis not present

## 2020-06-21 DIAGNOSIS — E46 Unspecified protein-calorie malnutrition: Secondary | ICD-10-CM | POA: Diagnosis not present

## 2020-06-21 DIAGNOSIS — I5033 Acute on chronic diastolic (congestive) heart failure: Secondary | ICD-10-CM | POA: Diagnosis not present

## 2020-06-21 DIAGNOSIS — E7800 Pure hypercholesterolemia, unspecified: Secondary | ICD-10-CM | POA: Diagnosis not present

## 2020-06-21 DIAGNOSIS — Z8673 Personal history of transient ischemic attack (TIA), and cerebral infarction without residual deficits: Secondary | ICD-10-CM | POA: Diagnosis not present

## 2020-06-21 DIAGNOSIS — R0689 Other abnormalities of breathing: Secondary | ICD-10-CM | POA: Diagnosis not present

## 2020-06-21 DIAGNOSIS — I130 Hypertensive heart and chronic kidney disease with heart failure and stage 1 through stage 4 chronic kidney disease, or unspecified chronic kidney disease: Secondary | ICD-10-CM | POA: Diagnosis not present

## 2020-06-21 DIAGNOSIS — Z9981 Dependence on supplemental oxygen: Secondary | ICD-10-CM | POA: Diagnosis not present

## 2020-06-21 DIAGNOSIS — E875 Hyperkalemia: Secondary | ICD-10-CM | POA: Diagnosis not present

## 2020-06-21 DIAGNOSIS — R0602 Shortness of breath: Secondary | ICD-10-CM | POA: Diagnosis not present

## 2020-06-21 DIAGNOSIS — E1142 Type 2 diabetes mellitus with diabetic polyneuropathy: Secondary | ICD-10-CM | POA: Diagnosis not present

## 2020-06-21 DIAGNOSIS — Z794 Long term (current) use of insulin: Secondary | ICD-10-CM | POA: Diagnosis not present

## 2020-06-22 DIAGNOSIS — E785 Hyperlipidemia, unspecified: Secondary | ICD-10-CM | POA: Diagnosis not present

## 2020-06-22 DIAGNOSIS — D631 Anemia in chronic kidney disease: Secondary | ICD-10-CM | POA: Diagnosis not present

## 2020-06-22 DIAGNOSIS — J189 Pneumonia, unspecified organism: Secondary | ICD-10-CM | POA: Diagnosis not present

## 2020-06-22 DIAGNOSIS — I160 Hypertensive urgency: Secondary | ICD-10-CM | POA: Diagnosis not present

## 2020-06-22 DIAGNOSIS — Z79899 Other long term (current) drug therapy: Secondary | ICD-10-CM | POA: Diagnosis not present

## 2020-06-22 DIAGNOSIS — J9621 Acute and chronic respiratory failure with hypoxia: Secondary | ICD-10-CM | POA: Diagnosis not present

## 2020-06-22 DIAGNOSIS — J90 Pleural effusion, not elsewhere classified: Secondary | ICD-10-CM | POA: Diagnosis not present

## 2020-06-22 DIAGNOSIS — E875 Hyperkalemia: Secondary | ICD-10-CM | POA: Diagnosis not present

## 2020-06-22 DIAGNOSIS — E1122 Type 2 diabetes mellitus with diabetic chronic kidney disease: Secondary | ICD-10-CM | POA: Diagnosis not present

## 2020-06-22 DIAGNOSIS — N184 Chronic kidney disease, stage 4 (severe): Secondary | ICD-10-CM | POA: Diagnosis not present

## 2020-06-22 DIAGNOSIS — I5033 Acute on chronic diastolic (congestive) heart failure: Secondary | ICD-10-CM | POA: Diagnosis not present

## 2020-06-22 DIAGNOSIS — R0602 Shortness of breath: Secondary | ICD-10-CM | POA: Diagnosis not present

## 2020-06-22 DIAGNOSIS — I130 Hypertensive heart and chronic kidney disease with heart failure and stage 1 through stage 4 chronic kidney disease, or unspecified chronic kidney disease: Secondary | ICD-10-CM | POA: Diagnosis not present

## 2020-06-22 DIAGNOSIS — F0390 Unspecified dementia without behavioral disturbance: Secondary | ICD-10-CM | POA: Diagnosis not present

## 2020-06-22 DIAGNOSIS — I2510 Atherosclerotic heart disease of native coronary artery without angina pectoris: Secondary | ICD-10-CM | POA: Diagnosis not present

## 2020-06-22 DIAGNOSIS — E039 Hypothyroidism, unspecified: Secondary | ICD-10-CM | POA: Diagnosis not present

## 2020-06-22 DIAGNOSIS — M109 Gout, unspecified: Secondary | ICD-10-CM | POA: Diagnosis not present

## 2020-06-22 DIAGNOSIS — F32A Depression, unspecified: Secondary | ICD-10-CM | POA: Diagnosis not present

## 2020-06-22 DIAGNOSIS — Z794 Long term (current) use of insulin: Secondary | ICD-10-CM | POA: Diagnosis not present

## 2020-06-22 DIAGNOSIS — Z87891 Personal history of nicotine dependence: Secondary | ICD-10-CM | POA: Diagnosis not present

## 2020-06-22 DIAGNOSIS — Z8673 Personal history of transient ischemic attack (TIA), and cerebral infarction without residual deficits: Secondary | ICD-10-CM | POA: Diagnosis not present

## 2020-06-29 DIAGNOSIS — I700 Atherosclerosis of aorta: Secondary | ICD-10-CM | POA: Diagnosis not present

## 2020-06-29 DIAGNOSIS — Z87440 Personal history of urinary (tract) infections: Secondary | ICD-10-CM | POA: Diagnosis not present

## 2020-06-29 DIAGNOSIS — Z8673 Personal history of transient ischemic attack (TIA), and cerebral infarction without residual deficits: Secondary | ICD-10-CM | POA: Diagnosis not present

## 2020-06-29 DIAGNOSIS — D631 Anemia in chronic kidney disease: Secondary | ICD-10-CM | POA: Diagnosis not present

## 2020-06-29 DIAGNOSIS — I2510 Atherosclerotic heart disease of native coronary artery without angina pectoris: Secondary | ICD-10-CM | POA: Diagnosis not present

## 2020-06-29 DIAGNOSIS — I5033 Acute on chronic diastolic (congestive) heart failure: Secondary | ICD-10-CM | POA: Diagnosis not present

## 2020-06-29 DIAGNOSIS — Z9981 Dependence on supplemental oxygen: Secondary | ICD-10-CM | POA: Diagnosis not present

## 2020-06-29 DIAGNOSIS — M109 Gout, unspecified: Secondary | ICD-10-CM | POA: Diagnosis not present

## 2020-06-29 DIAGNOSIS — E1142 Type 2 diabetes mellitus with diabetic polyneuropathy: Secondary | ICD-10-CM | POA: Diagnosis not present

## 2020-06-29 DIAGNOSIS — E46 Unspecified protein-calorie malnutrition: Secondary | ICD-10-CM | POA: Diagnosis not present

## 2020-06-29 DIAGNOSIS — I480 Paroxysmal atrial fibrillation: Secondary | ICD-10-CM | POA: Diagnosis not present

## 2020-06-29 DIAGNOSIS — J189 Pneumonia, unspecified organism: Secondary | ICD-10-CM | POA: Diagnosis not present

## 2020-06-29 DIAGNOSIS — N184 Chronic kidney disease, stage 4 (severe): Secondary | ICD-10-CM | POA: Diagnosis not present

## 2020-06-29 DIAGNOSIS — F32A Depression, unspecified: Secondary | ICD-10-CM | POA: Diagnosis not present

## 2020-06-29 DIAGNOSIS — Z87891 Personal history of nicotine dependence: Secondary | ICD-10-CM | POA: Diagnosis not present

## 2020-06-29 DIAGNOSIS — E039 Hypothyroidism, unspecified: Secondary | ICD-10-CM | POA: Diagnosis not present

## 2020-06-29 DIAGNOSIS — Z9181 History of falling: Secondary | ICD-10-CM | POA: Diagnosis not present

## 2020-06-29 DIAGNOSIS — J440 Chronic obstructive pulmonary disease with acute lower respiratory infection: Secondary | ICD-10-CM | POA: Diagnosis not present

## 2020-06-29 DIAGNOSIS — Z794 Long term (current) use of insulin: Secondary | ICD-10-CM | POA: Diagnosis not present

## 2020-06-29 DIAGNOSIS — M519 Unspecified thoracic, thoracolumbar and lumbosacral intervertebral disc disorder: Secondary | ICD-10-CM | POA: Diagnosis not present

## 2020-06-29 DIAGNOSIS — J9621 Acute and chronic respiratory failure with hypoxia: Secondary | ICD-10-CM | POA: Diagnosis not present

## 2020-06-29 DIAGNOSIS — I130 Hypertensive heart and chronic kidney disease with heart failure and stage 1 through stage 4 chronic kidney disease, or unspecified chronic kidney disease: Secondary | ICD-10-CM | POA: Diagnosis not present

## 2020-06-29 DIAGNOSIS — E1122 Type 2 diabetes mellitus with diabetic chronic kidney disease: Secondary | ICD-10-CM | POA: Diagnosis not present

## 2020-06-29 DIAGNOSIS — I160 Hypertensive urgency: Secondary | ICD-10-CM | POA: Diagnosis not present

## 2020-06-29 DIAGNOSIS — E7800 Pure hypercholesterolemia, unspecified: Secondary | ICD-10-CM | POA: Diagnosis not present

## 2020-06-30 ENCOUNTER — Telehealth: Payer: Self-pay

## 2020-06-30 NOTE — Telephone Encounter (Signed)
°  Transition Care Management Follow-up Telephone Call  Connie Cooley Dec 17, 1940  Admit Date: 06/22/20 Discharge Date: 06/26/20 Diagnoses: Resp failure with hypoxia, Bilat Pleural Effusion, CHF, PNA   2 day post discharge: 06/28/20 7 day post discharge: 07/03/20 14 day post discharge: 07/10/20  Connie Cooley was discharged from [facility] on 06/26/20 with the diagnoses listed above.  She was contacted today via telephone in regards to transition of care.  She did not answer the phone and there was no VM available.  I attempted to call her son as well with no answer.  She was brought to the ED on 12/19 via EMS after found to be hypoxic with an O2 of 73% on 1L by nasal canula.  She was started on BiPap, nitroglycerin drip and Lasix.  She reported that her [facility] had improved after being IV diuresed.    Connie Cooley was discharged with home health and on continuous oxygen at 2L.  NEW MEDICATION: Omnicef 300 mg PO BID #10 Discharge Weight: 159 lb 6.4 oz  Discharge Instructions: f/u with Dr Henrene Pastor in one week  Items Reviewed:  Medications reviewed: yes  Allergies reviewed: yes  Dietary changes reviewed: yes  Referrals reviewed: yes  Shelle Iron, LPN 40/97/35 3:29 PM

## 2020-07-02 DIAGNOSIS — I491 Atrial premature depolarization: Secondary | ICD-10-CM | POA: Diagnosis not present

## 2020-07-02 DIAGNOSIS — R55 Syncope and collapse: Secondary | ICD-10-CM | POA: Diagnosis not present

## 2020-07-02 DIAGNOSIS — E860 Dehydration: Secondary | ICD-10-CM | POA: Diagnosis not present

## 2020-07-02 DIAGNOSIS — I4891 Unspecified atrial fibrillation: Secondary | ICD-10-CM | POA: Diagnosis not present

## 2020-07-02 DIAGNOSIS — I10 Essential (primary) hypertension: Secondary | ICD-10-CM | POA: Diagnosis not present

## 2020-07-07 ENCOUNTER — Telehealth: Payer: Self-pay | Admitting: Legal Medicine

## 2020-07-07 DIAGNOSIS — I5033 Acute on chronic diastolic (congestive) heart failure: Secondary | ICD-10-CM | POA: Diagnosis not present

## 2020-07-07 DIAGNOSIS — I130 Hypertensive heart and chronic kidney disease with heart failure and stage 1 through stage 4 chronic kidney disease, or unspecified chronic kidney disease: Secondary | ICD-10-CM | POA: Diagnosis not present

## 2020-07-07 DIAGNOSIS — E1122 Type 2 diabetes mellitus with diabetic chronic kidney disease: Secondary | ICD-10-CM | POA: Diagnosis not present

## 2020-07-07 NOTE — Progress Notes (Signed)
  Chronic Care Management   Outreach Note  07/07/2020 Name: ELLEN GORIS MRN: [id number] DOB: 1940/10/25  Referred by: Lillard Anes, MD Reason for referral : Chronic Care Management   A second unsuccessful telephone outreach was attempted today. The patient was referred to pharmacist for assistance with care management and care coordination.  Follow Up Plan:   Hilario Quarry  Upstream Scheduler

## 2020-07-09 DIAGNOSIS — I509 Heart failure, unspecified: Secondary | ICD-10-CM | POA: Diagnosis not present

## 2020-07-14 ENCOUNTER — Ambulatory Visit: Payer: PPO | Admitting: Legal Medicine

## 2020-07-15 DIAGNOSIS — J9601 Acute respiratory failure with hypoxia: Secondary | ICD-10-CM | POA: Diagnosis not present

## 2020-07-16 ENCOUNTER — Ambulatory Visit: Payer: PPO | Admitting: Legal Medicine

## 2020-07-18 DIAGNOSIS — R001 Bradycardia, unspecified: Secondary | ICD-10-CM | POA: Diagnosis not present

## 2020-07-18 DIAGNOSIS — D649 Anemia, unspecified: Secondary | ICD-10-CM | POA: Diagnosis not present

## 2020-07-18 DIAGNOSIS — I272 Pulmonary hypertension, unspecified: Secondary | ICD-10-CM | POA: Diagnosis not present

## 2020-07-18 DIAGNOSIS — Z79810 Long term (current) use of selective estrogen receptor modulators (SERMs): Secondary | ICD-10-CM | POA: Diagnosis not present

## 2020-07-18 DIAGNOSIS — E11649 Type 2 diabetes mellitus with hypoglycemia without coma: Secondary | ICD-10-CM | POA: Diagnosis not present

## 2020-07-18 DIAGNOSIS — J90 Pleural effusion, not elsewhere classified: Secondary | ICD-10-CM | POA: Diagnosis not present

## 2020-07-18 DIAGNOSIS — Z20822 Contact with and (suspected) exposure to covid-19: Secondary | ICD-10-CM | POA: Diagnosis not present

## 2020-07-18 DIAGNOSIS — E039 Hypothyroidism, unspecified: Secondary | ICD-10-CM | POA: Diagnosis not present

## 2020-07-18 DIAGNOSIS — N183 Chronic kidney disease, stage 3 unspecified: Secondary | ICD-10-CM | POA: Diagnosis not present

## 2020-07-18 DIAGNOSIS — I2699 Other pulmonary embolism without acute cor pulmonale: Secondary | ICD-10-CM | POA: Diagnosis not present

## 2020-07-18 DIAGNOSIS — I130 Hypertensive heart and chronic kidney disease with heart failure and stage 1 through stage 4 chronic kidney disease, or unspecified chronic kidney disease: Secondary | ICD-10-CM | POA: Diagnosis not present

## 2020-07-18 DIAGNOSIS — Z515 Encounter for palliative care: Secondary | ICD-10-CM | POA: Diagnosis not present

## 2020-07-18 DIAGNOSIS — Z9981 Dependence on supplemental oxygen: Secondary | ICD-10-CM | POA: Diagnosis not present

## 2020-07-18 DIAGNOSIS — I959 Hypotension, unspecified: Secondary | ICD-10-CM | POA: Diagnosis not present

## 2020-07-18 DIAGNOSIS — Z87891 Personal history of nicotine dependence: Secondary | ICD-10-CM | POA: Diagnosis not present

## 2020-07-18 DIAGNOSIS — R0902 Hypoxemia: Secondary | ICD-10-CM | POA: Diagnosis not present

## 2020-07-18 DIAGNOSIS — Z79890 Hormone replacement therapy (postmenopausal): Secondary | ICD-10-CM | POA: Diagnosis not present

## 2020-07-18 DIAGNOSIS — Z66 Do not resuscitate: Secondary | ICD-10-CM | POA: Diagnosis not present

## 2020-07-18 DIAGNOSIS — I5030 Unspecified diastolic (congestive) heart failure: Secondary | ICD-10-CM | POA: Diagnosis not present

## 2020-07-18 DIAGNOSIS — R0602 Shortness of breath: Secondary | ICD-10-CM | POA: Diagnosis not present

## 2020-07-18 DIAGNOSIS — Z9049 Acquired absence of other specified parts of digestive tract: Secondary | ICD-10-CM | POA: Diagnosis not present

## 2020-07-18 DIAGNOSIS — Z794 Long term (current) use of insulin: Secondary | ICD-10-CM | POA: Diagnosis not present

## 2020-07-18 DIAGNOSIS — I472 Ventricular tachycardia: Secondary | ICD-10-CM | POA: Diagnosis not present

## 2020-07-18 DIAGNOSIS — Z862 Personal history of diseases of the blood and blood-forming organs and certain disorders involving the immune mechanism: Secondary | ICD-10-CM | POA: Diagnosis not present

## 2020-07-18 DIAGNOSIS — I517 Cardiomegaly: Secondary | ICD-10-CM | POA: Diagnosis not present

## 2020-07-18 DIAGNOSIS — D631 Anemia in chronic kidney disease: Secondary | ICD-10-CM | POA: Diagnosis not present

## 2020-07-18 DIAGNOSIS — J811 Chronic pulmonary edema: Secondary | ICD-10-CM | POA: Diagnosis not present

## 2020-07-18 DIAGNOSIS — Z79899 Other long term (current) drug therapy: Secondary | ICD-10-CM | POA: Diagnosis not present

## 2020-07-18 DIAGNOSIS — I5033 Acute on chronic diastolic (congestive) heart failure: Secondary | ICD-10-CM | POA: Diagnosis not present

## 2020-07-18 DIAGNOSIS — N189 Chronic kidney disease, unspecified: Secondary | ICD-10-CM | POA: Diagnosis not present

## 2020-07-18 DIAGNOSIS — J449 Chronic obstructive pulmonary disease, unspecified: Secondary | ICD-10-CM | POA: Diagnosis not present

## 2020-07-18 DIAGNOSIS — Z79811 Long term (current) use of aromatase inhibitors: Secondary | ICD-10-CM | POA: Diagnosis not present

## 2020-07-18 DIAGNOSIS — R0682 Tachypnea, not elsewhere classified: Secondary | ICD-10-CM | POA: Diagnosis not present

## 2020-07-18 DIAGNOSIS — E1122 Type 2 diabetes mellitus with diabetic chronic kidney disease: Secondary | ICD-10-CM | POA: Diagnosis not present

## 2020-07-18 DIAGNOSIS — N1832 Chronic kidney disease, stage 3b: Secondary | ICD-10-CM | POA: Diagnosis not present

## 2020-07-18 DIAGNOSIS — Z7982 Long term (current) use of aspirin: Secondary | ICD-10-CM | POA: Diagnosis not present

## 2020-07-18 DIAGNOSIS — R079 Chest pain, unspecified: Secondary | ICD-10-CM | POA: Diagnosis not present

## 2020-07-18 DIAGNOSIS — Z7983 Long term (current) use of bisphosphonates: Secondary | ICD-10-CM | POA: Diagnosis not present

## 2020-07-18 DIAGNOSIS — J9601 Acute respiratory failure with hypoxia: Secondary | ICD-10-CM | POA: Diagnosis not present

## 2020-07-18 DIAGNOSIS — I4891 Unspecified atrial fibrillation: Secondary | ICD-10-CM | POA: Diagnosis not present

## 2020-07-30 ENCOUNTER — Other Ambulatory Visit: Payer: Self-pay | Admitting: Legal Medicine

## 2020-08-05 DEATH — deceased

## 2021-04-21 IMAGING — CR DG CHEST 2V
2 series · 2 of 2 positions shown · non-contrast
Comparison: Radiograph 05/12/2019, CT 09/28/2016

CLINICAL DATA: Chest pain, tachycardia, history of CHF and
hypertension

EXAM:
CHEST - 2 VIEW

[w chest lat]
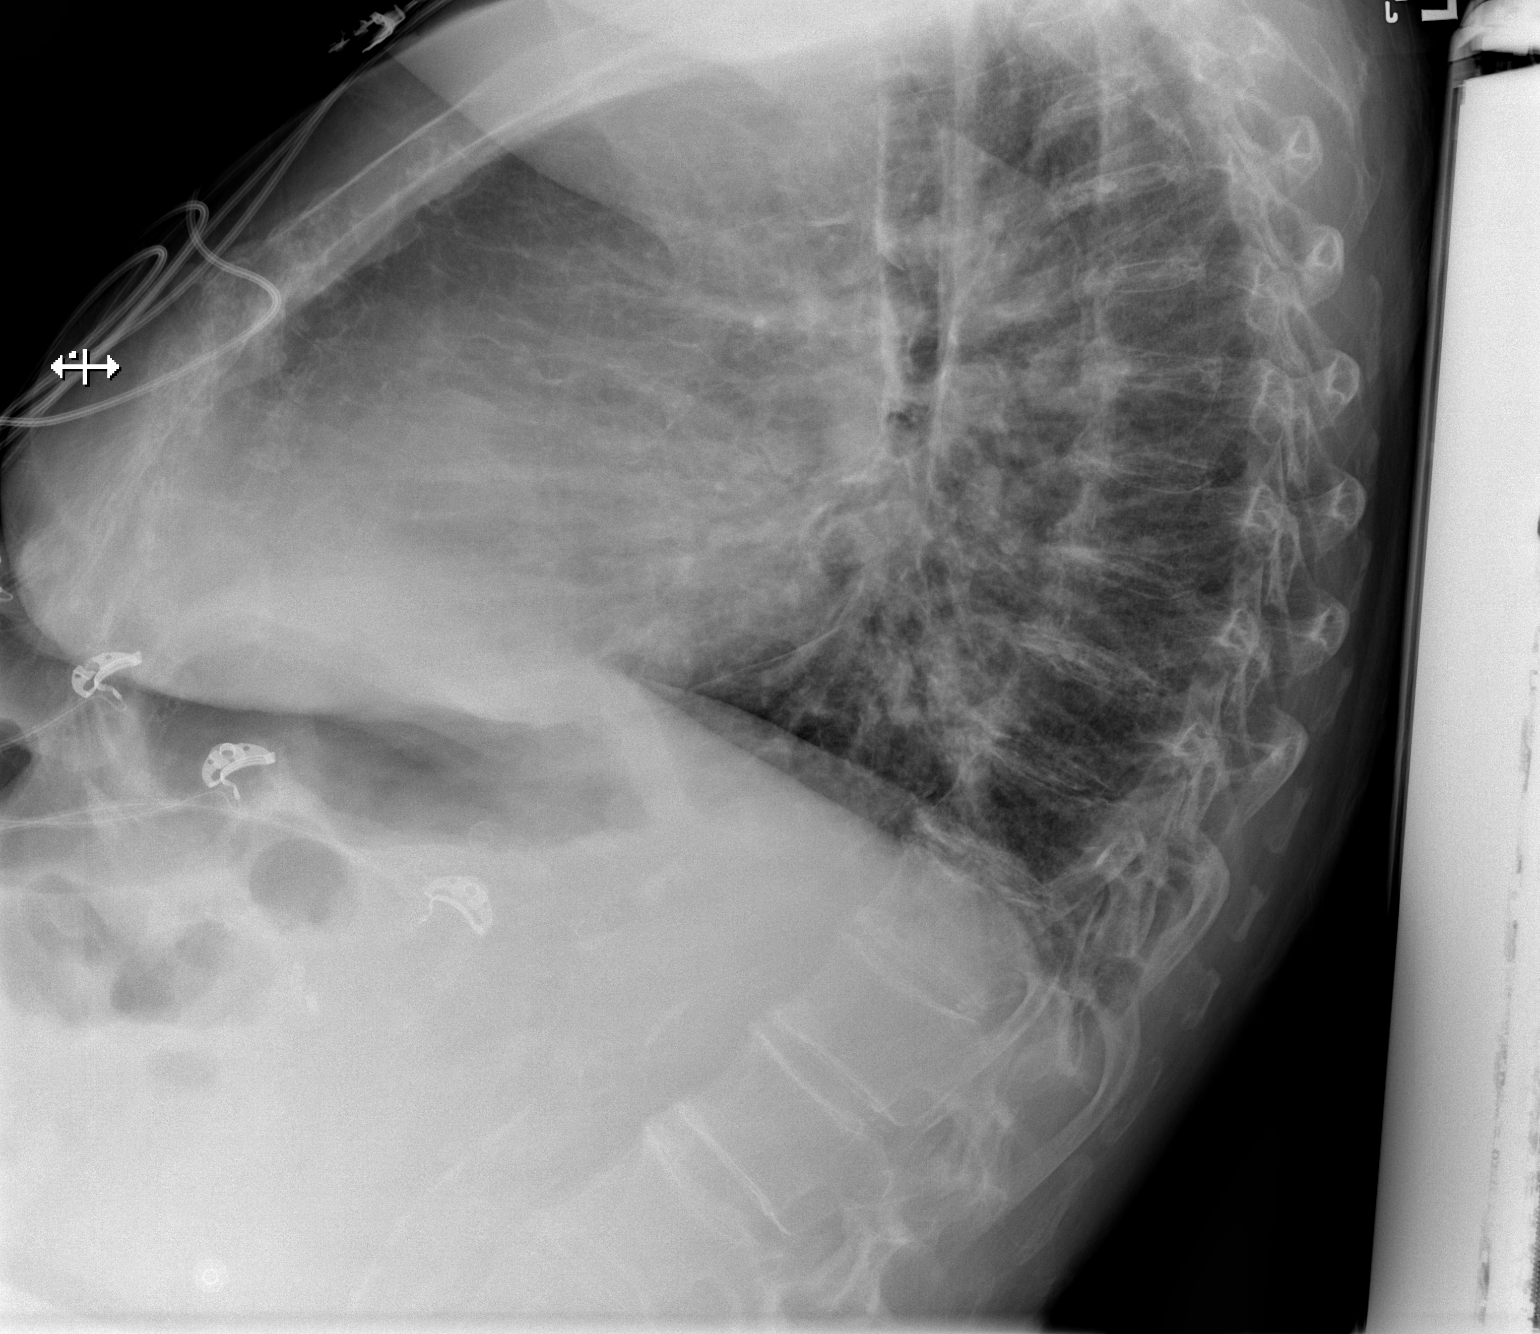

[x chest ap]
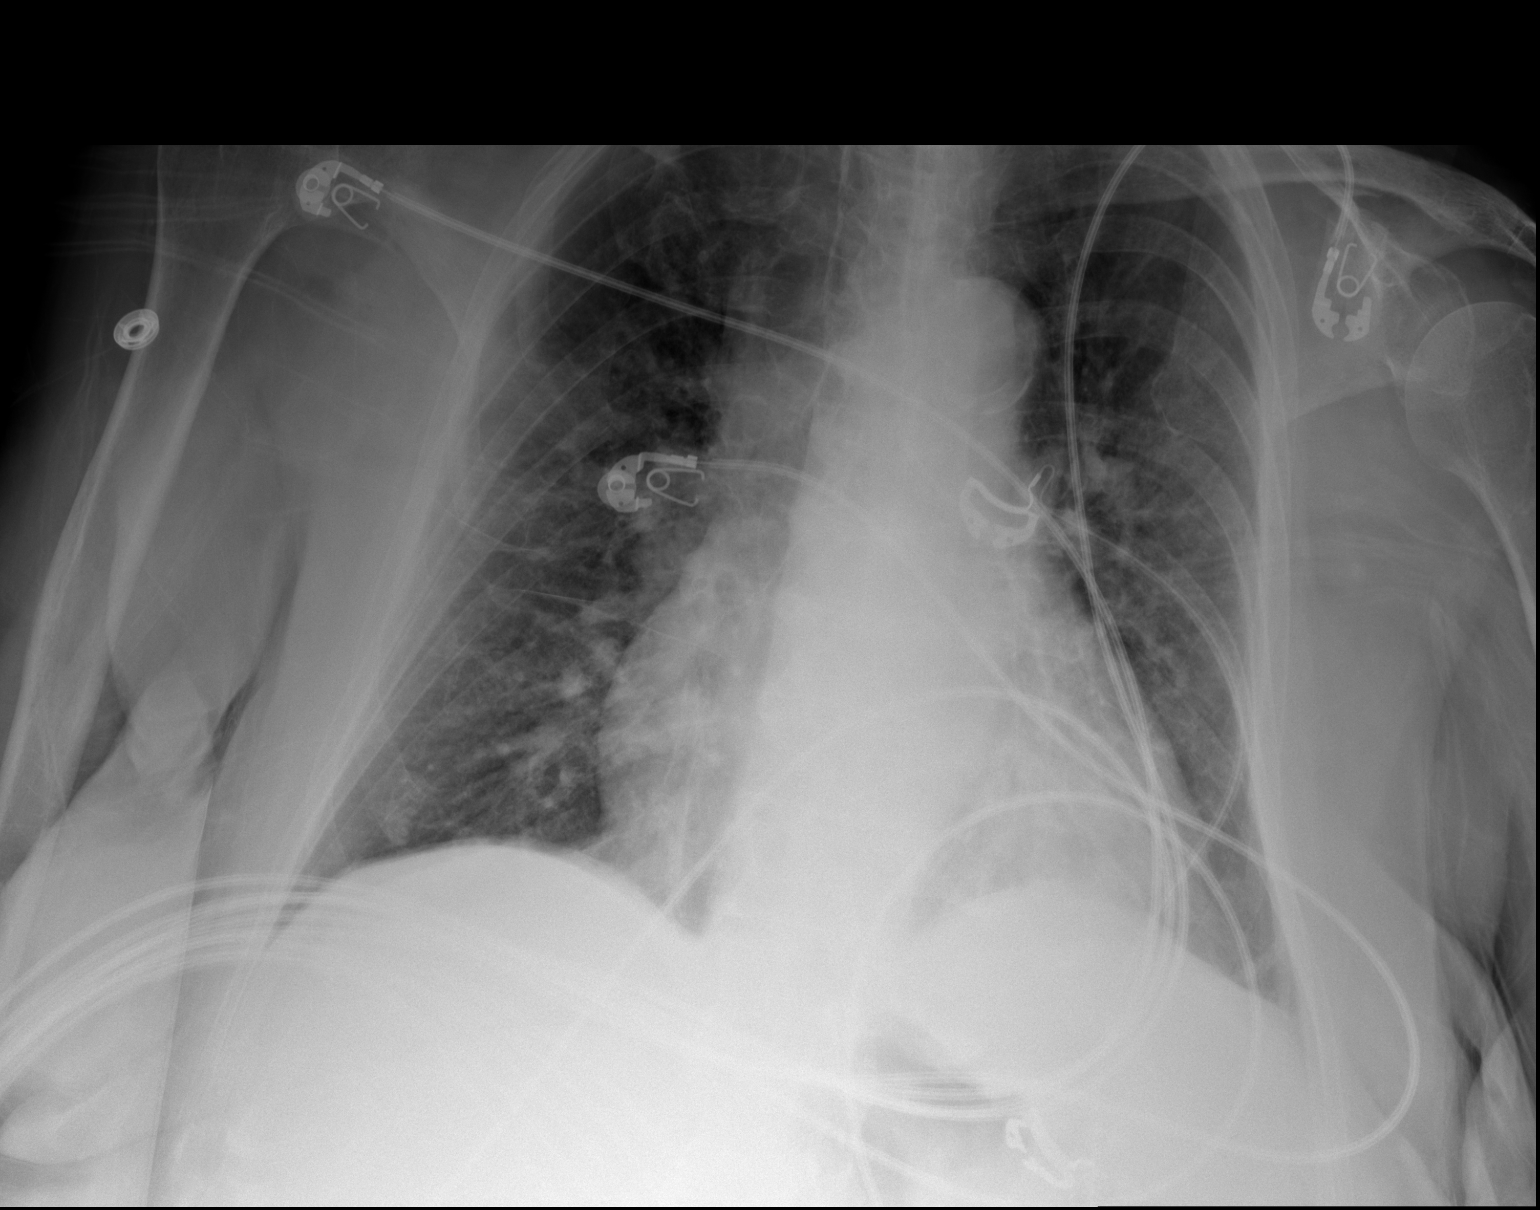

[2 of 2 positions shown; findings below may reference images not displayed]

FINDINGS: Hyperinflation of the lungs with flattening of the diaphragms and
chronic interstitial changes in the bases are similar to prior.
Patchy airspace disease is noted in the right mid lung periphery and
infrahilar lung with some air bronchograms. Suspect some streaky
retrocardiac opacity as well. No pneumothorax. No effusion. The
aorta is calcified. The remaining cardiomediastinal contours are
unremarkable. No acute osseous or soft tissue abnormality.
Degenerative changes are present in the imaged spine and shoulders.
Telemetry leads overlie the chest.
IMPRESSION: 1. Patchy airspace disease in the right mid lung periphery and
infrahilar lung suspicious for pneumonia.
2. Suspect some streaky retrocardiac opacity as well.
3. Background of hyperinflation with chronic interstitial changes
